# Patient Record
Sex: Female | Born: 1970 | Race: White | Hispanic: No | Marital: Married | Smoking: Never smoker
Health system: Southern US, Community
[De-identification: ages and names within clinical notes are randomized; demographics above are authoritative.]

## PROBLEM LIST (undated history)

## (undated) DIAGNOSIS — F419 Anxiety disorder, unspecified: Secondary | ICD-10-CM

## (undated) DIAGNOSIS — K819 Cholecystitis, unspecified: Secondary | ICD-10-CM

## (undated) DIAGNOSIS — F988 Other specified behavioral and emotional disorders with onset usually occurring in childhood and adolescence: Secondary | ICD-10-CM

## (undated) DIAGNOSIS — R002 Palpitations: Secondary | ICD-10-CM

## (undated) DIAGNOSIS — B029 Zoster without complications: Secondary | ICD-10-CM

## (undated) DIAGNOSIS — N83209 Unspecified ovarian cyst, unspecified side: Secondary | ICD-10-CM

## (undated) DIAGNOSIS — T783XXA Angioneurotic edema, initial encounter: Secondary | ICD-10-CM

## (undated) DIAGNOSIS — R42 Dizziness and giddiness: Secondary | ICD-10-CM

## (undated) HISTORY — DX: Cholecystitis, unspecified: K81.9

## (undated) HISTORY — DX: Unspecified ovarian cyst, unspecified side: N83.209

## (undated) HISTORY — DX: Anxiety disorder, unspecified: F41.9

## (undated) HISTORY — DX: Zoster without complications: B02.9

## (undated) HISTORY — DX: Palpitations: R00.2

## (undated) HISTORY — DX: Dizziness and giddiness: R42

## (undated) HISTORY — DX: Angioneurotic edema, initial encounter: T78.3XXA

## (undated) HISTORY — DX: Other specified behavioral and emotional disorders with onset usually occurring in childhood and adolescence: F98.8

---

## 1998-03-02 ENCOUNTER — Other Ambulatory Visit: Admission: RE | Admit: 1998-03-02 | Discharge: 1998-03-02 | Payer: Self-pay | Admitting: *Deleted

## 1998-11-23 ENCOUNTER — Encounter (INDEPENDENT_AMBULATORY_CARE_PROVIDER_SITE_OTHER): Payer: Self-pay | Admitting: Specialist

## 1998-11-23 ENCOUNTER — Inpatient Hospital Stay (HOSPITAL_COMMUNITY): Admission: AD | Admit: 1998-11-23 | Discharge: 1998-11-26 | Payer: Self-pay | Admitting: Obstetrics and Gynecology

## 1998-11-27 ENCOUNTER — Encounter (HOSPITAL_COMMUNITY): Admission: RE | Admit: 1998-11-27 | Discharge: 1999-02-25 | Payer: Self-pay | Admitting: Obstetrics and Gynecology

## 1998-12-20 ENCOUNTER — Other Ambulatory Visit: Admission: RE | Admit: 1998-12-20 | Discharge: 1998-12-20 | Payer: Self-pay | Admitting: Obstetrics and Gynecology

## 1999-12-25 ENCOUNTER — Other Ambulatory Visit: Admission: RE | Admit: 1999-12-25 | Discharge: 1999-12-25 | Payer: Self-pay | Admitting: Obstetrics and Gynecology

## 2000-12-25 ENCOUNTER — Other Ambulatory Visit: Admission: RE | Admit: 2000-12-25 | Discharge: 2000-12-25 | Payer: Self-pay | Admitting: Obstetrics and Gynecology

## 2002-01-04 ENCOUNTER — Other Ambulatory Visit: Admission: RE | Admit: 2002-01-04 | Discharge: 2002-01-04 | Payer: Self-pay | Admitting: Obstetrics & Gynecology

## 2002-08-31 ENCOUNTER — Encounter: Payer: Self-pay | Admitting: Family Medicine

## 2002-08-31 ENCOUNTER — Encounter: Admission: RE | Admit: 2002-08-31 | Discharge: 2002-08-31 | Payer: Self-pay | Admitting: Family Medicine

## 2003-02-10 ENCOUNTER — Other Ambulatory Visit: Admission: RE | Admit: 2003-02-10 | Discharge: 2003-02-10 | Payer: Self-pay | Admitting: Obstetrics & Gynecology

## 2008-06-20 ENCOUNTER — Emergency Department (HOSPITAL_COMMUNITY): Admission: EM | Admit: 2008-06-20 | Discharge: 2008-06-20 | Payer: Self-pay | Admitting: Family Medicine

## 2010-07-11 LAB — GLUCOSE, CAPILLARY: Glucose-Capillary: 98 mg/dL (ref 70–99)

## 2010-07-11 LAB — POCT URINALYSIS DIP (DEVICE): Glucose, UA: NEGATIVE mg/dL

## 2010-07-11 LAB — POCT PREGNANCY, URINE: Preg Test, Ur: NEGATIVE

## 2010-12-19 ENCOUNTER — Other Ambulatory Visit: Payer: Self-pay | Admitting: Obstetrics & Gynecology

## 2010-12-20 ENCOUNTER — Other Ambulatory Visit (HOSPITAL_COMMUNITY): Payer: Self-pay | Admitting: Family Medicine

## 2010-12-20 DIAGNOSIS — R1011 Right upper quadrant pain: Secondary | ICD-10-CM

## 2011-01-03 ENCOUNTER — Other Ambulatory Visit (HOSPITAL_COMMUNITY): Payer: Self-pay

## 2011-01-08 ENCOUNTER — Other Ambulatory Visit: Payer: Self-pay | Admitting: Obstetrics & Gynecology

## 2011-01-08 DIAGNOSIS — Z1231 Encounter for screening mammogram for malignant neoplasm of breast: Secondary | ICD-10-CM

## 2011-01-22 ENCOUNTER — Ambulatory Visit
Admission: RE | Admit: 2011-01-22 | Discharge: 2011-01-22 | Disposition: A | Discharge status: Home / Self Care | Payer: BC Managed Care – PPO | Source: Ambulatory Visit | Attending: Obstetrics & Gynecology | Admitting: Obstetrics & Gynecology

## 2011-01-22 DIAGNOSIS — Z1231 Encounter for screening mammogram for malignant neoplasm of breast: Secondary | ICD-10-CM

## 2011-10-10 ENCOUNTER — Ambulatory Visit
Admission: RE | Admit: 2011-10-10 | Discharge: 2011-10-10 | Disposition: A | Discharge status: Home / Self Care | Payer: BC Managed Care – PPO | Source: Ambulatory Visit | Attending: Family Medicine | Admitting: Family Medicine

## 2011-10-10 ENCOUNTER — Other Ambulatory Visit: Payer: Self-pay | Admitting: Family Medicine

## 2011-10-10 DIAGNOSIS — M25519 Pain in unspecified shoulder: Secondary | ICD-10-CM

## 2012-10-25 ENCOUNTER — Other Ambulatory Visit: Payer: Self-pay

## 2012-10-25 DIAGNOSIS — Z1231 Encounter for screening mammogram for malignant neoplasm of breast: Secondary | ICD-10-CM

## 2012-11-10 ENCOUNTER — Ambulatory Visit
Admission: RE | Admit: 2012-11-10 | Discharge: 2012-11-10 | Disposition: A | Discharge status: Home / Self Care | Payer: BC Managed Care – PPO | Source: Ambulatory Visit

## 2012-11-10 DIAGNOSIS — Z1231 Encounter for screening mammogram for malignant neoplasm of breast: Secondary | ICD-10-CM

## 2014-05-05 ENCOUNTER — Other Ambulatory Visit: Payer: Self-pay

## 2014-05-05 DIAGNOSIS — Z1231 Encounter for screening mammogram for malignant neoplasm of breast: Secondary | ICD-10-CM

## 2014-05-11 ENCOUNTER — Ambulatory Visit
Admission: RE | Admit: 2014-05-11 | Discharge: 2014-05-11 | Disposition: A | Discharge status: Home / Self Care | Payer: 59 | Source: Ambulatory Visit

## 2014-05-11 ENCOUNTER — Encounter (INDEPENDENT_AMBULATORY_CARE_PROVIDER_SITE_OTHER): Payer: Self-pay

## 2014-05-11 DIAGNOSIS — Z1231 Encounter for screening mammogram for malignant neoplasm of breast: Secondary | ICD-10-CM

## 2014-11-10 ENCOUNTER — Ambulatory Visit (INDEPENDENT_AMBULATORY_CARE_PROVIDER_SITE_OTHER): Payer: 59 | Admitting: Gynecology

## 2014-11-10 ENCOUNTER — Encounter: Payer: Self-pay | Admitting: Gynecology

## 2014-11-10 VITALS — BP 118/74 | Ht 64.0 in | Wt 139.0 lb

## 2014-11-10 DIAGNOSIS — N926 Irregular menstruation, unspecified: Secondary | ICD-10-CM

## 2014-11-10 LAB — CBC WITH DIFFERENTIAL/PLATELET
Basophils Absolute: 0 K/uL (ref 0.0–0.1)
Basophils Relative: 0 % (ref 0–1)
Eosinophils Absolute: 0.1 K/uL (ref 0.0–0.7)
Eosinophils Relative: 1 % (ref 0–5)
HCT: 39.4 % (ref 36.0–46.0)
Hemoglobin: 13 g/dL (ref 12.0–15.0)
Lymphocytes Relative: 29 % (ref 12–46)
Lymphs Abs: 1.7 K/uL (ref 0.7–4.0)
MCH: 30 pg (ref 26.0–34.0)
MCHC: 33 g/dL (ref 30.0–36.0)
MCV: 91 fL (ref 78.0–100.0)
MPV: 9.5 fL (ref 8.6–12.4)
Monocytes Absolute: 0.4 K/uL (ref 0.1–1.0)
Monocytes Relative: 7 % (ref 3–12)
Neutro Abs: 3.7 K/uL (ref 1.7–7.7)
Neutrophils Relative %: 63 % (ref 43–77)
Platelets: 272 K/uL (ref 150–400)
RBC: 4.33 MIL/uL (ref 3.87–5.11)
RDW: 12.7 % (ref 11.5–15.5)
WBC: 5.9 K/uL (ref 4.0–10.5)

## 2014-11-10 LAB — TSH: TSH: 2.612 u[IU]/mL (ref 0.350–4.500)

## 2014-11-10 NOTE — Progress Notes (Signed)
Chloe Hanson May 31, 1970 [id number]        44 y.o.  [id number] new patient presents complaining of irregular bleeding. Patient notes regular monthly menses up until several years ago when her menses started to become more irregular where she would have a period every 2 weeks. Had trial of several different birth control pills but could not tolerate them due to mental changes. Had tried a Mirena IUD but had it removed due to pelvic cramping particularly after intercourse. Most recently saw Dr. Dellis Filbert  this past Februaryand had an ultrasound where she was told she had an ovarian cyst several centimeters that would probably resolve on its own. She received a Depo-Provera and had some breakthrough bleeding and received a second dose in May but now is noted spotting with heavier bleeding last week. Has history of one cesarean section using condoms for contraception and desires no further childbirth. Relates having a full exam to include breast exam in February last Pap smear reported 2014 which was normal historically and no history of abnormal Pap smears previously.  Past medical history,surgical history, problem list, medications, allergies, family history and social history were all reviewed and documented in the EPIC chart.  Directed ROS with pertinent positives and negatives documented in the history of present illness/assessment and plan.  Exam: Kim assistant Filed Vitals:   11/10/14 1414  BP: 118/74  Height: 5\' 4"  (1.626 m)  Weight: 139 lb (63.05 kg)   General appearance:  Normal Spine straight without CVA tenderness Abdomen soft nontender without masses guarding rebound Pelvic external BUS vagina normal. Cervix normal. Uterus grossly normal size midline mobile nontender. Adnexa without masses or tenderness.  Assessment/Plan:  44 y.o. [id number] irregular bleeding currently at the tail end of her Depo-Provera. Inability to tolerate oral contraceptives. Trial of Mirena IUD unsuccessful due to  cramping. Will start with baseline labs to include CBC TSH FSH prolactin and hCG. Schedule sonohysterogram to rule out intracavitary pathology and reassess ovaries with history of cyst in February. Various scenarios and possibilities reviewed to include retry of Mirena IUD or Skyla. Will rediscuss treatment options after labs and ultrasound results.    Anastasio Auerbach MD, 2:58 PM 11/10/2014

## 2014-11-10 NOTE — Patient Instructions (Signed)
Follow up for ultrasound as scheduled 

## 2014-11-11 LAB — PROLACTIN: PROLACTIN: 14.2 ng/mL

## 2014-11-11 LAB — HCG, SERUM, QUALITATIVE: PREG SERUM: NEGATIVE

## 2014-11-11 LAB — FOLLICLE STIMULATING HORMONE: FSH: 29.6 m[IU]/mL

## 2014-11-14 ENCOUNTER — Other Ambulatory Visit: Payer: Self-pay | Admitting: Gynecology

## 2014-11-14 DIAGNOSIS — N939 Abnormal uterine and vaginal bleeding, unspecified: Secondary | ICD-10-CM

## 2014-11-30 ENCOUNTER — Telehealth: Payer: Self-pay | Admitting: Gynecology

## 2014-11-30 NOTE — Telephone Encounter (Signed)
11/30/14-PT was advised today the her [employer] ins puts the cost of her sonohysterogram towards her $500 deductible of which only $256.31 is met. The allowable for the test is $875.76 so she will also be responsible for 20% coins on the $632.37 remaining after her remaining $243.39 of the deductible is subtracted from the allowable. If bx is needed she would have a 20% coins on $269.62 or $53.92 additional. She has a remaining balance on her oop of $1222.17 so that is why she owes what she does.wl

## 2014-12-07 ENCOUNTER — Encounter: Payer: Self-pay | Admitting: Gynecology

## 2014-12-07 ENCOUNTER — Other Ambulatory Visit: Payer: Self-pay | Admitting: Gynecology

## 2014-12-07 ENCOUNTER — Ambulatory Visit (INDEPENDENT_AMBULATORY_CARE_PROVIDER_SITE_OTHER): Payer: 59 | Admitting: Gynecology

## 2014-12-07 ENCOUNTER — Ambulatory Visit (INDEPENDENT_AMBULATORY_CARE_PROVIDER_SITE_OTHER): Payer: 59

## 2014-12-07 VITALS — BP 124/80 | Ht 64.0 in | Wt 139.0 lb

## 2014-12-07 DIAGNOSIS — N8320 Unspecified ovarian cysts: Secondary | ICD-10-CM

## 2014-12-07 DIAGNOSIS — N939 Abnormal uterine and vaginal bleeding, unspecified: Secondary | ICD-10-CM | POA: Diagnosis not present

## 2014-12-07 DIAGNOSIS — D251 Intramural leiomyoma of uterus: Secondary | ICD-10-CM

## 2014-12-07 DIAGNOSIS — N926 Irregular menstruation, unspecified: Secondary | ICD-10-CM

## 2014-12-07 DIAGNOSIS — N83202 Unspecified ovarian cyst, left side: Secondary | ICD-10-CM

## 2014-12-07 NOTE — Patient Instructions (Signed)
Follow up if your irregular bleeding continues.  Office will call you with the biopsy results

## 2014-12-07 NOTE — Progress Notes (Signed)
Chloe Hanson 05-Nov-1970 [id number]        44 y.o.  [id number] Presents for sonohysterogram due to irregular bleeding as outlined in my 11/10/2014 note. Patient notes that she has done no further bleeding since her office appointment.  Past medical history,surgical history, problem list, medications, allergies, family history and social history were all reviewed and documented in the EPIC chart.  Directed ROS with pertinent positives and negatives documented in the history of present illness/assessment and plan.  Exam: [place] assistant Filed Vitals:   12/07/14 1458  BP: 124/80  Height: 5\' 4"  (1.626 m)  Weight: 139 lb (63.05 kg)   General appearance:  Normal Pelvic external BUS vagina normal. Cervix normal. Uterus normal size midline mobile nontender. Adnexa without masses or tenderness.  Ultrasound shows uterus normal size with several small myomas 19 mm and 9 mm.  Endometrial echo 3.7 mm Right ovary normal. Left ovary with thin-walled echo-free avascular 32 mm mean cyst. Cul-de-sac negative.  Sonohysterogram performed, sterile technique, easy catheter introduction, good distention with no abnormalities.  Endometrial biopsy taken. Patient tolerated well.  Assessment/Plan:  44 y.o. [id number] with irregular bleeding over the past year  Having been tried on several different birth control pills, Mirena IUD and subsequent Depo-Provera all with continued bleeding. Has stopped at this point. Had normal TSH prolactin and hemoglobin of 13. FSH was mildly elevated at 29. Patient will follow up for biopsy results. Keep menstrual calendar over the next several months. Possible early menopause discussed or possibly timing during the menstrual cycle accounts for mild elevation. Will follow up if irregular menses continue. If resolves then will follow up when she is due for her annual exam.    Anastasio Auerbach MD, 3:15 PM 12/07/2014

## 2015-02-11 DIAGNOSIS — F909 Attention-deficit hyperactivity disorder, unspecified type: Secondary | ICD-10-CM | POA: Insufficient documentation

## 2015-06-21 ENCOUNTER — Telehealth: Payer: Self-pay | Admitting: *Deleted

## 2015-06-21 NOTE — Telephone Encounter (Signed)
Patient has never had an annual exam with me. Her evaluation last September was due to heavy bleeding. I did recommend scheduling an exam now and we can discuss birth control options.

## 2015-06-21 NOTE — Telephone Encounter (Signed)
Pt informed with the below note, pt was at work and said she will call back to schedule annual exam

## 2015-06-21 NOTE — Telephone Encounter (Signed)
Pt called requesting Rx for OrthoEvra patch could be sent to pharmacy? Pt annual due in August, or OV to discuss? Please advise

## 2016-06-23 ENCOUNTER — Other Ambulatory Visit: Payer: Self-pay | Admitting: Gynecology

## 2016-06-23 DIAGNOSIS — Z1231 Encounter for screening mammogram for malignant neoplasm of breast: Secondary | ICD-10-CM

## 2016-07-09 ENCOUNTER — Ambulatory Visit
Admission: RE | Admit: 2016-07-09 | Discharge: 2016-07-09 | Disposition: A | Discharge status: Home / Self Care | Payer: 59 | Source: Ambulatory Visit | Attending: Gynecology | Admitting: Gynecology

## 2016-07-09 DIAGNOSIS — Z1231 Encounter for screening mammogram for malignant neoplasm of breast: Secondary | ICD-10-CM

## 2016-07-16 ENCOUNTER — Encounter: Payer: Self-pay | Admitting: Gynecology

## 2016-07-16 ENCOUNTER — Ambulatory Visit (INDEPENDENT_AMBULATORY_CARE_PROVIDER_SITE_OTHER): Payer: 59 | Admitting: Gynecology

## 2016-07-16 VITALS — BP 116/70 | Ht 65.0 in | Wt 145.0 lb

## 2016-07-16 DIAGNOSIS — N951 Menopausal and female climacteric states: Secondary | ICD-10-CM | POA: Diagnosis not present

## 2016-07-16 DIAGNOSIS — Z01419 Encounter for gynecological examination (general) (routine) without abnormal findings: Secondary | ICD-10-CM

## 2016-07-16 MED ORDER — NORETHINDRONE ACET-ETHINYL EST 1-20 MG-MCG PO TABS: 1.0000 | ORAL_TABLET | Freq: Every day | ORAL | 6 refills | Status: DC

## 2016-07-16 NOTE — Progress Notes (Signed)
    Chloe Hanson Apr 30, 1970 [id number]        46 y.o.  [id number] for annual exam.  Also complaining of menopausal symptoms with primarily having hot flushes and night sweats. Also noting mood swings and irritability. Some foggy thinking. Menses are somewhat irregular where she will have some skips. Overall though still having monthly menses. Had workup 2 years ago to include sonohysterogram which was negative and FSH which was 29.  Past medical history,surgical history, problem list, medications, allergies, family history and social history were all reviewed and documented as reviewed in the EPIC chart.  ROS:  Performed with pertinent positives and negatives included in the history, assessment and plan.   Additional significant findings :  None   Exam: [name] assistant Vitals:   07/16/16 1545  BP: 116/70  Weight: 145 lb (65.8 kg)  Height: 5\' 5"  (1.651 m)   Body mass index is 24.13 kg/m.  General appearance:  Normal affect, orientation and appearance. Skin: Grossly normal HEENT: Without gross lesions.  No cervical or supraclavicular adenopathy. Thyroid normal.  Lungs:  Clear without wheezing, rales or rhonchi Cardiac: RR, without RMG Abdominal:  Soft, nontender, without masses, guarding, rebound, organomegaly or hernia Breasts:  Examined lying and sitting without masses, retractions, discharge or axillary adenopathy. Pelvic:  Ext, BUS, Vagina: Normal  Cervix: Normal  Uterus: Anteverted, normal size, shape and contour, midline and mobile nontender   Adnexa: Without masses or tenderness    Anus and perineum: Normal   Rectovaginal: Normal sphincter tone without palpated masses or tenderness.    Assessment/Plan:  46 y.o. [id number] female for annual exam.   1. Menopausal symptoms. The patient is having classic menopausal symptoms. I discussed the perimenopause with the patient and options to include observation, OTC products and prescription medication. I reviewed HRT with her.  Given that she still is having some menses I feel she'll be having an issue with irregular bleeding on low-dose HRT. I also reviewed starting low-dose birth control pills which would give her both menstrual regulation and hopefully symptom relief. She is healthy and never smoked. Risks to include thrombosis such as stroke heart attack DVT discussed. Patient wants to go ahead and start this and will start her on a Loestrin 1/20 equivalent. Refill 1 year provided. Every other to every third month withdrawal option discussed. This also will give her more consistent birth control as she is using condoms now. 2. Mammography 06/2016. Continue with annual mammography next year. SBE monthly. 3. Pap smear 2014. Pap smear done today. No history of significant abnormal Pap smears. 4. Health maintenance. Patient relates recently having full blood work done through her primary physician's office. Follow up in one year, sooner if any issues with initiation of her low-dose oral contraceptives.  Additional time in excess of her routine gynecologic exam was spent in direct face to face counseling and coordination of care in regards to her menopausal symptoms and management.    Anastasio Auerbach MD, 4:09 PM 07/16/2016

## 2016-07-16 NOTE — Patient Instructions (Signed)
Start on birth control pills as we discussed. Call me if you have any issues with these at all.

## 2016-07-16 NOTE — Addendum Note (Signed)
Addended by: Nelva Nay on: 07/16/2016 04:42 PM   Modules accepted: Orders

## 2016-07-17 LAB — PAP IG W/ RFLX HPV ASCU

## 2017-02-27 ENCOUNTER — Ambulatory Visit (INDEPENDENT_AMBULATORY_CARE_PROVIDER_SITE_OTHER): Payer: 59 | Admitting: Gynecology

## 2017-02-27 ENCOUNTER — Encounter: Payer: Self-pay | Admitting: Gynecology

## 2017-02-27 VITALS — BP 118/74

## 2017-02-27 DIAGNOSIS — N926 Irregular menstruation, unspecified: Secondary | ICD-10-CM

## 2017-02-27 NOTE — Progress Notes (Signed)
    Chloe Hanson 1970/05/17 [id number]        46 y.o.  [id number] presents with 3 months of postcoital spotting.  Was started on oral contraceptives earlier this year due to mild menstrual irregularity with some skips and menopausal symptoms to include hot flushes and sweats.  Patient notes her symptoms are much better and was doing well initially but now she is having consistent spotting after intercourse although no spontaneous bleeding.  Also noting some pelvic discomfort after intercourse.  Is doing an every 4-month withdrawal on a Loestrin 1/20 equivalent.  Had sonohysterogram 2016 due to irregular bleeding which showed several small myomas but otherwise unremarkable.  Past medical history,surgical history, problem list, medications, allergies, family history and social history were all reviewed and documented in the EPIC chart.  Directed ROS with pertinent positives and negatives documented in the history of present illness/assessment and plan.  Exam: [name] assistant Vitals:   02/27/17 1547  BP: 118/74   General appearance:  Normal Abdomen soft nontender without masses guarding rebound Pelvic external BUS vagina normal.  Cervix grossly normal.  Uterus normal size midline mobile nontender.  Adnexa without masses or tenderness.  Assessment/Plan:  46 y.o. [id number] with postcoital spotting times 3 months.  Pap smear 06/2016 normal.  Recommend start with ultrasound rule out pelvic pathology and for endometrial assessment.  Possible sonohysterogram if endometrial echo thickened or abnormal.  Possible adjustments of her oral contraceptives to stabilize the endometrium with higher estrogen dose.  Will reassess after the ultrasound.    Anastasio Auerbach MD, 4:02 PM 02/27/2017

## 2017-02-27 NOTE — Patient Instructions (Signed)
Follow up for ultrasound as scheduled 

## 2017-03-06 ENCOUNTER — Other Ambulatory Visit: Payer: Self-pay | Admitting: Gynecology

## 2017-03-10 NOTE — Telephone Encounter (Signed)
Refill called IN [facility]

## 2017-03-12 ENCOUNTER — Other Ambulatory Visit: Payer: Self-pay | Admitting: Gynecology

## 2017-03-12 DIAGNOSIS — N930 Postcoital and contact bleeding: Secondary | ICD-10-CM

## 2017-04-02 ENCOUNTER — Ambulatory Visit (INDEPENDENT_AMBULATORY_CARE_PROVIDER_SITE_OTHER): Payer: 59 | Admitting: Gynecology

## 2017-04-02 ENCOUNTER — Other Ambulatory Visit: Payer: Self-pay | Admitting: Gynecology

## 2017-04-02 ENCOUNTER — Ambulatory Visit (INDEPENDENT_AMBULATORY_CARE_PROVIDER_SITE_OTHER): Payer: 59

## 2017-04-02 ENCOUNTER — Encounter: Payer: Self-pay | Admitting: Gynecology

## 2017-04-02 VITALS — BP 116/74

## 2017-04-02 DIAGNOSIS — N930 Postcoital and contact bleeding: Secondary | ICD-10-CM | POA: Diagnosis not present

## 2017-04-02 DIAGNOSIS — N926 Irregular menstruation, unspecified: Secondary | ICD-10-CM | POA: Diagnosis not present

## 2017-04-02 DIAGNOSIS — D259 Leiomyoma of uterus, unspecified: Secondary | ICD-10-CM

## 2017-04-02 DIAGNOSIS — D251 Intramural leiomyoma of uterus: Secondary | ICD-10-CM

## 2017-04-02 NOTE — Progress Notes (Signed)
    Chloe Hanson 05-Jun-1970 [id number]        47 y.o.  [id number] presents for sonohysterogram due to postcoital bleeding.  History of leiomyoma in the past.  On low-dose oral contraceptives.  Past medical history,surgical history, problem list, medications, allergies, family history and social history were all reviewed and documented in the EPIC chart.  Directed ROS with pertinent positives and negatives documented in the history of present illness/assessment and plan.  Exam: Pam [place] assistant Vitals:   04/02/17 1528  BP: 116/74   General appearance:  Normal Abdomen soft nontender without masses guarding rebound Pelvic external BUS vagina normal.  Cervix normal.  Uterus normal size midline mobile nontender.  Adnexa without masses or tenderness  Ultrasound transvaginal and transabdominal shows uterus normal size with 2 small myomas 20 mm and 17 mm.  Endometrial echo 7.7 mm.  Right ovary normal.  Left ovary with 2 simple echo-free cysts 21 and 22 mm.  Negative color flow Doppler.  Cul-de-sac negative.  Sonohysterogram performed, sterile technique, easy catheter introduction, good distention with no abnormalities.  Endometrial sample taken.  Patient tolerated well    Assessment/Plan:  47 y.o. [id number] with postcoital bleeding.  Ultrasound shows 2 small myomas but cavity otherwise normal.  Endometrial sample taken.  Patient will follow-up for results.  For now we will keep menstrual calendar and will see if her bleeding does not resolve.  If it continues we discussed several options to include going to an every other month withdrawal versus every 9-month withdrawal and see if this does not help stabilize the endometrium versus switching to a different pill such as a higher estrogen 1/35 formulation.  Patient will monitor her bleeding for now and call if she continues to have postcoital spotting.    Anastasio Auerbach MD, 3:41 PM 04/02/2017

## 2017-04-02 NOTE — Patient Instructions (Signed)
Office will call you with biopsy results.  Call if the irregular bleeding continues. 

## 2017-07-17 ENCOUNTER — Encounter: Payer: 59 | Admitting: Gynecology

## 2017-07-20 ENCOUNTER — Encounter: Payer: 59 | Admitting: Gynecology

## 2017-07-20 DIAGNOSIS — Z0289 Encounter for other administrative examinations: Secondary | ICD-10-CM

## 2017-07-24 ENCOUNTER — Other Ambulatory Visit: Payer: Self-pay | Admitting: Gynecology

## 2017-09-18 ENCOUNTER — Other Ambulatory Visit: Payer: Self-pay | Admitting: Gynecology

## 2017-11-27 DIAGNOSIS — M5137 Other intervertebral disc degeneration, lumbosacral region: Secondary | ICD-10-CM | POA: Diagnosis not present

## 2017-11-27 DIAGNOSIS — M9903 Segmental and somatic dysfunction of lumbar region: Secondary | ICD-10-CM | POA: Diagnosis not present

## 2017-11-27 DIAGNOSIS — M9904 Segmental and somatic dysfunction of sacral region: Secondary | ICD-10-CM | POA: Diagnosis not present

## 2017-12-02 DIAGNOSIS — M5137 Other intervertebral disc degeneration, lumbosacral region: Secondary | ICD-10-CM | POA: Diagnosis not present

## 2017-12-02 DIAGNOSIS — M9904 Segmental and somatic dysfunction of sacral region: Secondary | ICD-10-CM | POA: Diagnosis not present

## 2017-12-02 DIAGNOSIS — M9903 Segmental and somatic dysfunction of lumbar region: Secondary | ICD-10-CM | POA: Diagnosis not present

## 2017-12-11 DIAGNOSIS — M9904 Segmental and somatic dysfunction of sacral region: Secondary | ICD-10-CM | POA: Diagnosis not present

## 2017-12-11 DIAGNOSIS — M5137 Other intervertebral disc degeneration, lumbosacral region: Secondary | ICD-10-CM | POA: Diagnosis not present

## 2017-12-11 DIAGNOSIS — M9903 Segmental and somatic dysfunction of lumbar region: Secondary | ICD-10-CM | POA: Diagnosis not present

## 2017-12-16 DIAGNOSIS — M9904 Segmental and somatic dysfunction of sacral region: Secondary | ICD-10-CM | POA: Diagnosis not present

## 2017-12-16 DIAGNOSIS — M5137 Other intervertebral disc degeneration, lumbosacral region: Secondary | ICD-10-CM | POA: Diagnosis not present

## 2017-12-16 DIAGNOSIS — M9903 Segmental and somatic dysfunction of lumbar region: Secondary | ICD-10-CM | POA: Diagnosis not present

## 2018-01-11 ENCOUNTER — Ambulatory Visit (INDEPENDENT_AMBULATORY_CARE_PROVIDER_SITE_OTHER): Payer: 59 | Admitting: Family Medicine

## 2018-01-11 ENCOUNTER — Encounter: Payer: Self-pay | Admitting: Family Medicine

## 2018-01-11 VITALS — BP 104/82 | HR 90 | Temp 98.5°F | Ht 65.0 in | Wt 147.0 lb

## 2018-01-11 DIAGNOSIS — J302 Other seasonal allergic rhinitis: Secondary | ICD-10-CM

## 2018-01-11 DIAGNOSIS — Z7689 Persons encountering health services in other specified circumstances: Secondary | ICD-10-CM

## 2018-01-11 DIAGNOSIS — R232 Flushing: Secondary | ICD-10-CM

## 2018-01-11 DIAGNOSIS — F419 Anxiety disorder, unspecified: Secondary | ICD-10-CM

## 2018-01-11 DIAGNOSIS — Z8669 Personal history of other diseases of the nervous system and sense organs: Secondary | ICD-10-CM

## 2018-01-11 DIAGNOSIS — R631 Polydipsia: Secondary | ICD-10-CM | POA: Diagnosis not present

## 2018-01-11 DIAGNOSIS — F988 Other specified behavioral and emotional disorders with onset usually occurring in childhood and adolescence: Secondary | ICD-10-CM

## 2018-01-11 LAB — POCT GLYCOSYLATED HEMOGLOBIN (HGB A1C): HEMOGLOBIN A1C: 5.2 % (ref 4.0–5.6)

## 2018-01-11 LAB — POC URINALSYSI DIPSTICK (AUTOMATED)
BILIRUBIN UA: NEGATIVE
Blood, UA: NEGATIVE
GLUCOSE UA: NEGATIVE
KETONES UA: NEGATIVE
Leukocytes, UA: NEGATIVE
Nitrite, UA: NEGATIVE
PH UA: 7 (ref 5.0–8.0)
Protein, UA: NEGATIVE
SPEC GRAV UA: 1.01 (ref 1.010–1.025)
Urobilinogen, UA: 0.2 E.U./dL

## 2018-01-11 NOTE — Patient Instructions (Signed)
Perimenopause Perimenopause is the time when your body begins to move into the menopause (no menstrual period for 12 straight months). It is a natural process. Perimenopause can begin 2-8 years before the menopause and usually lasts for 1 year after the menopause. During this time, your ovaries may or may not produce an egg. The ovaries vary in their production of estrogen and progesterone hormones each month. This can cause irregular menstrual periods, difficulty getting pregnant, vaginal bleeding between periods, and uncomfortable symptoms. What are the causes?  Irregular production of the ovarian hormones, estrogen and progesterone, and not ovulating every month. Other causes include:  Tumor of the pituitary gland in the brain.  Medical disease that affects the ovaries.  Radiation treatment.  Chemotherapy.  Unknown causes.  Heavy smoking and excessive alcohol intake can bring on perimenopause sooner.  What are the signs or symptoms?  Hot flashes.  Night sweats.  Irregular menstrual periods.  Decreased sex drive.  Vaginal dryness.  Headaches.  Mood swings.  Depression.  Memory problems.  Irritability.  Tiredness.  Weight gain.  Trouble getting pregnant.  The beginning of losing bone cells (osteoporosis).  The beginning of hardening of the arteries (atherosclerosis). How is this diagnosed? Your health care provider will make a diagnosis by analyzing your age, menstrual history, and symptoms. He or she will do a physical exam and note any changes in your body, especially your female organs. Female hormone tests may or may not be helpful depending on the amount of female hormones you produce and when you produce them. However, other hormone tests may be helpful to rule out other problems. How is this treated? In some cases, no treatment is needed. The decision on whether treatment is necessary during the perimenopause should be made by you and your health care  provider based on how the symptoms are affecting you and your lifestyle. Various treatments are available, such as:  Treating individual symptoms with a specific medicine for that symptom.  Herbal medicines that can help specific symptoms.  Counseling.  Group therapy.  Follow these instructions at home:  Keep track of your menstrual periods (when they occur, how heavy they are, how long between periods, and how long they last) as well as your symptoms and when they started.  Only take over-the-counter or prescription medicines as directed by your health care provider.  Sleep and rest.  Exercise.  Eat a diet that contains calcium (good for your bones) and soy (acts like the estrogen hormone).  Do not smoke.  Avoid alcoholic beverages.  Take vitamin supplements as recommended by your health care provider. Taking vitamin E may help in certain cases.  Take calcium and vitamin D supplements to help prevent bone loss.  Group therapy is sometimes helpful.  Acupuncture may help in some cases. Contact a health care provider if:  You have questions about any symptoms you are having.  You need a referral to a specialist (gynecologist, psychiatrist, or psychologist). Get help right away if:  You have vaginal bleeding.  Your period lasts longer than 8 days.  Your periods are recurring sooner than 21 days.  You have bleeding after intercourse.  You have severe depression.  You have pain when you urinate.  You have severe headaches.  You have vision problems. This information is not intended to replace advice given to you by your health care provider. Make sure you discuss any questions you have with your health care provider. Document Released: 04/24/2004 Document Revised: 08/23/2015 Document Reviewed: 10/14/2012  Elsevier Interactive Patient Education  2017 [employer].  Migraine Headache A migraine headache is an intense, throbbing pain on one side or both sides of  the head. Migraines may also cause other symptoms, such as nausea, vomiting, and sensitivity to light and noise. What are the causes? Doing or taking certain things may also trigger migraines, such as:  Alcohol.  Smoking.  Medicines, such as: ? Medicine used to treat chest pain (nitroglycerine). ? Birth control pills. ? Estrogen pills. ? Certain blood pressure medicines.  Aged cheeses, chocolate, or caffeine.  Foods or drinks that contain nitrates, glutamate, aspartame, or tyramine.  Physical activity.  Other things that may trigger a migraine include:  Menstruation.  Pregnancy.  Hunger.  Stress, lack of sleep, too much sleep, or fatigue.  Weather changes.  What increases the risk? The following factors may make you more likely to experience migraine headaches:  Age. Risk increases with age.  Family history of migraine headaches.  Being Caucasian.  Depression and anxiety.  Obesity.  Being a woman.  Having a hole in the heart (patent foramen ovale) or other heart problems.  What are the signs or symptoms? The main symptom of this condition is pulsating or throbbing pain. Pain may:  Happen in any area of the head, such as on one side or both sides.  Interfere with daily activities.  Get worse with physical activity.  Get worse with exposure to bright lights or loud noises.  Other symptoms may include:  Nausea.  Vomiting.  Dizziness.  General sensitivity to bright lights, loud noises, or smells.  Before you get a migraine, you may get warning signs that a migraine is developing (aura). An aura may include:  Seeing flashing lights or having blind spots.  Seeing bright spots, halos, or zigzag lines.  Having tunnel vision or blurred vision.  Having numbness or a tingling feeling.  Having trouble talking.  Having muscle weakness.  How is this diagnosed? A migraine headache can be diagnosed based on:  Your symptoms.  A physical  exam.  Tests, such as CT scan or MRI of the head. These imaging tests can help rule out other causes of headaches.  Taking fluid from the spine (lumbar puncture) and analyzing it (cerebrospinal fluid analysis, or CSF analysis).  How is this treated? A migraine headache is usually treated with medicines that:  Relieve pain.  Relieve nausea.  Prevent migraines from coming back.  Treatment may also include:  Acupuncture.  Lifestyle changes like avoiding foods that trigger migraines.  Follow these instructions at home: Medicines  Take over-the-counter and prescription medicines only as told by your health care provider.  Do not drive or use heavy machinery while taking prescription pain medicine.  To prevent or treat constipation while you are taking prescription pain medicine, your health care provider may recommend that you: ? Drink enough fluid to keep your urine clear or pale yellow. ? Take over-the-counter or prescription medicines. ? Eat foods that are high in fiber, such as fresh fruits and vegetables, whole grains, and beans. ? Limit foods that are high in fat and processed sugars, such as fried and sweet foods. Lifestyle  Avoid alcohol use.  Do not use any products that contain nicotine or tobacco, such as cigarettes and e-cigarettes. If you need help quitting, ask your health care provider.  Get at least 8 hours of sleep every night.  Limit your stress. General instructions   Keep a journal to find out what may trigger your migraine  headaches. For example, write down: ? What you eat and drink. ? How much sleep you get. ? Any change to your diet or medicines.  If you have a migraine: ? Avoid things that make your symptoms worse, such as bright lights. ? It may help to lie down in a dark, quiet room. ? Do not drive or use heavy machinery. ? Ask your health care provider what activities are safe for you while you are experiencing symptoms.  Keep all  follow-up visits as told by your health care provider. This is important. Contact a health care provider if:  You develop symptoms that are different or more severe than your usual migraine symptoms. Get help right away if:  Your migraine becomes severe.  You have a fever.  You have a stiff neck.  You have vision loss.  Your muscles feel weak or like you cannot control them.  You start to lose your balance often.  You develop trouble walking.  You faint. This information is not intended to replace advice given to you by your health care provider. Make sure you discuss any questions you have with your health care provider. Document Released: 03/17/2005 Document Revised: 10/05/2015 Document Reviewed: 09/03/2015 Elsevier Interactive Patient Education  2017 [employer].

## 2018-01-11 NOTE — Progress Notes (Signed)
Patient presents to clinic today to f/u on chronic issues and establish care.  SUBJECTIVE: PMH:  Pt is a 47 yo female with pmh sig for ADD, anxiety, migraines, chronic headaches, chronic back pain.  Pt seen at [facility] and by OB/Gyn.   Acute concern: -pt endorses increaesed thirst and urination. -she denies h/o DM -drinks mostly coffee and seltzer water -thought had a UTI a few wks ago, given abx by a friend at [facility]. -thought symptoms were returning so increased fluid intake.  Hot flashes: -LMP Jan 2019 -taking black coash with some relief  ADD: -dx'd by [place] Attention specialist.  Has not seen them in a while -taking Adderall  20 mg in am and 10 mg in pm.  H/o anxiety/PTSD: -pt states she witnessed a shooting. -pt notes she is taking xanax prn which she gets from her friend at [facility]. -Patient also notes falling off a horse in 2013 hurting her back  Seasonal allergies: -may take OTC med  Allergies: Latex  Past surgical history: C-section 2000  Social history: Pt is married.  She has 1 son who is 66.  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  Pt endorses social alcohol use.  Pt denies tobacco and drug use.   Health Maintenance: Dental --2019 Chiropractor--Dr. [facility] --August 2019 Mammogram --February 2018 LMP--January 2019 PAP --2018 or 19   Past Medical History:  Diagnosis Date  . ADD (attention deficit disorder)   . Gall bladder inflammation   . Ovarian cyst   . Shingles     Past Surgical History:  Procedure Laterality Date  . CESAREAN SECTION  2000    Current Outpatient Medications on File Prior to Visit  Medication Sig Dispense Refill  . ALPRAZolam (XANAX) 0.25 MG tablet Take 0.25 mg by mouth at bedtime as needed for anxiety.    Marland Kitchen amphetamine-dextroamphetamine (ADDERALL) 15 MG tablet Take 15 mg by mouth daily.    Marland Kitchen amphetamine-dextroamphetamine (ADDERALL) 20 MG tablet Take 20 mg by mouth daily.    .  norethindrone-ethinyl estradiol (MICROGESTIN,JUNEL,LOESTRIN) 1-20 MG-MCG tablet TAKE 1 TABLET BY MOUTH EVERY DAY CONTINUOSLY 42 tablet 0  . ValACYclovir HCl (VALTREX PO) Take by mouth.     No current facility-administered medications on file prior to visit.     Allergies  Allergen Reactions  . Latex Itching and Rash    Redness and rash with latex use    Family History  Problem Relation Age of Onset  . Breast cancer Mother 49  . Heart disease Father   . Cancer Father        Skin    Social History   Socioeconomic History  . Marital status: Married    Spouse name: Not on file  . Number of children: Not on file  . Years of education: Not on file  . Highest education level: Not on file  Occupational History  . Not on file  Social Needs  . Financial resource strain: Not on file  . Food insecurity:    Worry: Not on file    Inability: Not on file  . Transportation needs:    Medical: Not on file    Non-medical: Not on file  Tobacco Use  . Smoking status: Never Smoker  . Smokeless tobacco: Never Used  Substance and Sexual Activity  . Alcohol use: Yes    Alcohol/week: 0.0 standard drinks    Comment: Rare  . Drug use: No  . Sexual activity: Yes  Birth control/protection: Condom    Comment: 1st intercourse 47 yo-More than 5 partners  Lifestyle  . Physical activity:    Days per week: Not on file    Minutes per session: Not on file  . Stress: Not on file  Relationships  . Social connections:    Talks on phone: Not on file    Gets together: Not on file    Attends religious service: Not on file    Active member of club or organization: Not on file    Attends meetings of clubs or organizations: Not on file    Relationship status: Not on file  . Intimate partner violence:    Fear of current or ex partner: Not on file    Emotionally abused: Not on file    Physically abused: Not on file    Forced sexual activity: Not on file  Other Topics Concern  . Not on file    Social History Narrative  . Not on file    ROS General: Denies fever, chills, night sweats, changes in weight, changes in appetite  +increased thirst, hot flashes HEENT: Denies headaches, ear pain, changes in vision, rhinorrhea, sore throat CV: Denies CP, palpitations, SOB, orthopnea Pulm: Denies SOB, cough, wheezing GI: Denies abdominal pain, nausea, vomiting, diarrhea, constipation GU: Denies dysuria, hematuria, vaginal discharge  +frequency Msk: Denies muscle cramps, joint pains Neuro: Denies weakness, numbness, tingling Skin: Denies rashes, bruising Psych: Denies depression, hallucinations  +anxiety, ADD  BP 104/82 (BP Location: Left Arm, Patient Position: Sitting, Cuff Size: Normal)   Pulse 90   Temp 98.5 F (36.9 C) (Oral)   Ht 5\' 5"  (1.651 m)   Wt 147 lb (66.7 kg)   LMP 04/13/2017   SpO2 98%   BMI 24.46 kg/m   Physical Exam Gen. Pleasant, well developed, well-nourished, in NAD HEENT - [place]/AT, PERRL, no scleral icterus, no nasal drainage, pharynx without erythema or exudate. Lungs: no use of accessory muscles, CTAB, no wheezes, rales or rhonchi Cardiovascular: RRR,  No r/g/m, no peripheral edema Abdomen: BS present, soft, nontender,nondistended Neuro:  A&Ox3, CN II-XII intact, normal gait Skin:  Warm, dry, intact, no lesions  No results found for this or any previous visit (from the past 2160 hour(s)).  Assessment/Plan: Increased thirst  -discussed drinking flat water daily. -hgb A1C was 5.2% this visit -UA normal - Plan: POCT glycosylated hemoglobin (Hb A1C), POCT Urinalysis Dipstick (Automated)  History of migraine -discussed limiting caffeine, getting plenty of rest, and drinking plenty of water daily.  Seasonal allergies -continue OTC meds prn  Anxiety -discussed this provider does not rx Benzodiazepines.  Pt given option of TOC, psychiatry, or weaning off med.  Pt states will get xanax from her previous provider (friend at [facility]).  Attention deficit  disorder (ADD) without hyperactivity -continue Adderall 20 mg in am and 10 mg in pm  Encounter to establish care -We reviewed the PMH, PSH, FH, SH, Meds and Allergies. -We provided refills for any medications we will prescribe as needed. -We addressed current concerns per orders and patient instructions. -We have asked for records for pertinent exams, studies, vaccines and notes from previous providers. -We have advised patient to follow up per instructions below.  Hot flashes -Continue black coash -Given handout  F/u prn.  Pt should strongly consider continuing care at [facility] as they filled several of pt's chronic meds in the last few days including adderall, xanax, valtrex, and apparently restarted her OCPs per chart review.  Grier Mitts, MD

## 2018-01-12 ENCOUNTER — Encounter: Payer: Self-pay | Admitting: Family Medicine

## 2018-01-12 DIAGNOSIS — J302 Other seasonal allergic rhinitis: Secondary | ICD-10-CM | POA: Insufficient documentation

## 2018-01-12 DIAGNOSIS — Z8669 Personal history of other diseases of the nervous system and sense organs: Secondary | ICD-10-CM | POA: Insufficient documentation

## 2018-01-12 DIAGNOSIS — F988 Other specified behavioral and emotional disorders with onset usually occurring in childhood and adolescence: Secondary | ICD-10-CM | POA: Insufficient documentation

## 2018-01-12 DIAGNOSIS — F419 Anxiety disorder, unspecified: Secondary | ICD-10-CM | POA: Insufficient documentation

## 2018-03-08 ENCOUNTER — Other Ambulatory Visit: Payer: Self-pay | Admitting: Gynecology

## 2018-03-08 DIAGNOSIS — Z1231 Encounter for screening mammogram for malignant neoplasm of breast: Secondary | ICD-10-CM

## 2018-03-10 DIAGNOSIS — Z13228 Encounter for screening for other metabolic disorders: Secondary | ICD-10-CM | POA: Diagnosis not present

## 2018-03-15 DIAGNOSIS — Z1283 Encounter for screening for malignant neoplasm of skin: Secondary | ICD-10-CM | POA: Diagnosis not present

## 2018-03-15 DIAGNOSIS — Z0000 Encounter for general adult medical examination without abnormal findings: Secondary | ICD-10-CM | POA: Diagnosis not present

## 2018-04-02 DIAGNOSIS — D1801 Hemangioma of skin and subcutaneous tissue: Secondary | ICD-10-CM | POA: Diagnosis not present

## 2018-04-02 DIAGNOSIS — D225 Melanocytic nevi of trunk: Secondary | ICD-10-CM | POA: Diagnosis not present

## 2018-04-02 DIAGNOSIS — D2261 Melanocytic nevi of right upper limb, including shoulder: Secondary | ICD-10-CM | POA: Diagnosis not present

## 2018-04-02 DIAGNOSIS — D0471 Carcinoma in situ of skin of right lower limb, including hip: Secondary | ICD-10-CM | POA: Diagnosis not present

## 2018-04-09 DIAGNOSIS — B078 Other viral warts: Secondary | ICD-10-CM | POA: Diagnosis not present

## 2018-04-09 DIAGNOSIS — D0471 Carcinoma in situ of skin of right lower limb, including hip: Secondary | ICD-10-CM | POA: Diagnosis not present

## 2018-04-15 ENCOUNTER — Ambulatory Visit
Admission: RE | Admit: 2018-04-15 | Discharge: 2018-04-15 | Disposition: A | Discharge status: Home / Self Care | Payer: 59 | Source: Ambulatory Visit | Attending: Gynecology | Admitting: Gynecology

## 2018-04-15 DIAGNOSIS — Z1231 Encounter for screening mammogram for malignant neoplasm of breast: Secondary | ICD-10-CM | POA: Diagnosis not present

## 2018-06-23 DIAGNOSIS — M9907 Segmental and somatic dysfunction of upper extremity: Secondary | ICD-10-CM | POA: Diagnosis not present

## 2018-06-23 DIAGNOSIS — M75100 Unspecified rotator cuff tear or rupture of unspecified shoulder, not specified as traumatic: Secondary | ICD-10-CM | POA: Diagnosis not present

## 2018-06-23 DIAGNOSIS — M9901 Segmental and somatic dysfunction of cervical region: Secondary | ICD-10-CM | POA: Diagnosis not present

## 2018-06-25 DIAGNOSIS — M9907 Segmental and somatic dysfunction of upper extremity: Secondary | ICD-10-CM | POA: Diagnosis not present

## 2018-06-25 DIAGNOSIS — M9901 Segmental and somatic dysfunction of cervical region: Secondary | ICD-10-CM | POA: Diagnosis not present

## 2018-06-25 DIAGNOSIS — M75100 Unspecified rotator cuff tear or rupture of unspecified shoulder, not specified as traumatic: Secondary | ICD-10-CM | POA: Diagnosis not present

## 2018-06-29 DIAGNOSIS — M9907 Segmental and somatic dysfunction of upper extremity: Secondary | ICD-10-CM | POA: Diagnosis not present

## 2018-06-29 DIAGNOSIS — M9901 Segmental and somatic dysfunction of cervical region: Secondary | ICD-10-CM | POA: Diagnosis not present

## 2018-06-29 DIAGNOSIS — M75100 Unspecified rotator cuff tear or rupture of unspecified shoulder, not specified as traumatic: Secondary | ICD-10-CM | POA: Diagnosis not present

## 2018-07-01 DIAGNOSIS — M9907 Segmental and somatic dysfunction of upper extremity: Secondary | ICD-10-CM | POA: Diagnosis not present

## 2018-07-01 DIAGNOSIS — M9901 Segmental and somatic dysfunction of cervical region: Secondary | ICD-10-CM | POA: Diagnosis not present

## 2018-07-01 DIAGNOSIS — M75100 Unspecified rotator cuff tear or rupture of unspecified shoulder, not specified as traumatic: Secondary | ICD-10-CM | POA: Diagnosis not present

## 2018-07-06 DIAGNOSIS — M25512 Pain in left shoulder: Secondary | ICD-10-CM | POA: Diagnosis not present

## 2018-07-06 DIAGNOSIS — M542 Cervicalgia: Secondary | ICD-10-CM | POA: Diagnosis not present

## 2018-07-08 DIAGNOSIS — M25612 Stiffness of left shoulder, not elsewhere classified: Secondary | ICD-10-CM | POA: Diagnosis not present

## 2018-07-08 DIAGNOSIS — M7502 Adhesive capsulitis of left shoulder: Secondary | ICD-10-CM | POA: Diagnosis not present

## 2018-07-08 DIAGNOSIS — M25512 Pain in left shoulder: Secondary | ICD-10-CM | POA: Diagnosis not present

## 2018-07-12 DIAGNOSIS — M25612 Stiffness of left shoulder, not elsewhere classified: Secondary | ICD-10-CM | POA: Diagnosis not present

## 2018-07-12 DIAGNOSIS — M25512 Pain in left shoulder: Secondary | ICD-10-CM | POA: Diagnosis not present

## 2018-07-12 DIAGNOSIS — M7502 Adhesive capsulitis of left shoulder: Secondary | ICD-10-CM | POA: Diagnosis not present

## 2018-07-14 DIAGNOSIS — M25612 Stiffness of left shoulder, not elsewhere classified: Secondary | ICD-10-CM | POA: Diagnosis not present

## 2018-07-14 DIAGNOSIS — M7502 Adhesive capsulitis of left shoulder: Secondary | ICD-10-CM | POA: Diagnosis not present

## 2018-07-14 DIAGNOSIS — M9901 Segmental and somatic dysfunction of cervical region: Secondary | ICD-10-CM | POA: Diagnosis not present

## 2018-07-14 DIAGNOSIS — M9907 Segmental and somatic dysfunction of upper extremity: Secondary | ICD-10-CM | POA: Diagnosis not present

## 2018-07-14 DIAGNOSIS — M75100 Unspecified rotator cuff tear or rupture of unspecified shoulder, not specified as traumatic: Secondary | ICD-10-CM | POA: Diagnosis not present

## 2018-07-14 DIAGNOSIS — M25512 Pain in left shoulder: Secondary | ICD-10-CM | POA: Diagnosis not present

## 2018-07-19 DIAGNOSIS — M25612 Stiffness of left shoulder, not elsewhere classified: Secondary | ICD-10-CM | POA: Diagnosis not present

## 2018-07-19 DIAGNOSIS — M7502 Adhesive capsulitis of left shoulder: Secondary | ICD-10-CM | POA: Diagnosis not present

## 2018-07-19 DIAGNOSIS — M25512 Pain in left shoulder: Secondary | ICD-10-CM | POA: Diagnosis not present

## 2018-07-21 DIAGNOSIS — M9901 Segmental and somatic dysfunction of cervical region: Secondary | ICD-10-CM | POA: Diagnosis not present

## 2018-07-21 DIAGNOSIS — M9907 Segmental and somatic dysfunction of upper extremity: Secondary | ICD-10-CM | POA: Diagnosis not present

## 2018-07-21 DIAGNOSIS — M75100 Unspecified rotator cuff tear or rupture of unspecified shoulder, not specified as traumatic: Secondary | ICD-10-CM | POA: Diagnosis not present

## 2018-07-22 DIAGNOSIS — M25612 Stiffness of left shoulder, not elsewhere classified: Secondary | ICD-10-CM | POA: Diagnosis not present

## 2018-07-22 DIAGNOSIS — M25512 Pain in left shoulder: Secondary | ICD-10-CM | POA: Diagnosis not present

## 2018-07-22 DIAGNOSIS — M7502 Adhesive capsulitis of left shoulder: Secondary | ICD-10-CM | POA: Diagnosis not present

## 2018-07-27 DIAGNOSIS — M25512 Pain in left shoulder: Secondary | ICD-10-CM | POA: Diagnosis not present

## 2018-07-27 DIAGNOSIS — M25612 Stiffness of left shoulder, not elsewhere classified: Secondary | ICD-10-CM | POA: Diagnosis not present

## 2018-07-27 DIAGNOSIS — M7502 Adhesive capsulitis of left shoulder: Secondary | ICD-10-CM | POA: Diagnosis not present

## 2018-07-28 DIAGNOSIS — M9907 Segmental and somatic dysfunction of upper extremity: Secondary | ICD-10-CM | POA: Diagnosis not present

## 2018-07-28 DIAGNOSIS — M75100 Unspecified rotator cuff tear or rupture of unspecified shoulder, not specified as traumatic: Secondary | ICD-10-CM | POA: Diagnosis not present

## 2018-07-28 DIAGNOSIS — M9901 Segmental and somatic dysfunction of cervical region: Secondary | ICD-10-CM | POA: Diagnosis not present

## 2018-07-28 DIAGNOSIS — E039 Hypothyroidism, unspecified: Secondary | ICD-10-CM | POA: Diagnosis not present

## 2018-07-29 DIAGNOSIS — M7502 Adhesive capsulitis of left shoulder: Secondary | ICD-10-CM | POA: Diagnosis not present

## 2018-07-29 DIAGNOSIS — M25612 Stiffness of left shoulder, not elsewhere classified: Secondary | ICD-10-CM | POA: Diagnosis not present

## 2018-07-29 DIAGNOSIS — M25512 Pain in left shoulder: Secondary | ICD-10-CM | POA: Diagnosis not present

## 2018-08-03 DIAGNOSIS — M7502 Adhesive capsulitis of left shoulder: Secondary | ICD-10-CM | POA: Diagnosis not present

## 2018-08-03 DIAGNOSIS — M25512 Pain in left shoulder: Secondary | ICD-10-CM | POA: Diagnosis not present

## 2018-08-03 DIAGNOSIS — M25612 Stiffness of left shoulder, not elsewhere classified: Secondary | ICD-10-CM | POA: Diagnosis not present

## 2018-08-04 DIAGNOSIS — M9907 Segmental and somatic dysfunction of upper extremity: Secondary | ICD-10-CM | POA: Diagnosis not present

## 2018-08-04 DIAGNOSIS — M9901 Segmental and somatic dysfunction of cervical region: Secondary | ICD-10-CM | POA: Diagnosis not present

## 2018-08-04 DIAGNOSIS — M75100 Unspecified rotator cuff tear or rupture of unspecified shoulder, not specified as traumatic: Secondary | ICD-10-CM | POA: Diagnosis not present

## 2018-08-05 DIAGNOSIS — M25612 Stiffness of left shoulder, not elsewhere classified: Secondary | ICD-10-CM | POA: Diagnosis not present

## 2018-08-05 DIAGNOSIS — M25512 Pain in left shoulder: Secondary | ICD-10-CM | POA: Diagnosis not present

## 2018-08-05 DIAGNOSIS — M7502 Adhesive capsulitis of left shoulder: Secondary | ICD-10-CM | POA: Diagnosis not present

## 2018-08-12 DIAGNOSIS — M25612 Stiffness of left shoulder, not elsewhere classified: Secondary | ICD-10-CM | POA: Diagnosis not present

## 2018-08-12 DIAGNOSIS — M7502 Adhesive capsulitis of left shoulder: Secondary | ICD-10-CM | POA: Diagnosis not present

## 2018-08-12 DIAGNOSIS — M25512 Pain in left shoulder: Secondary | ICD-10-CM | POA: Diagnosis not present

## 2018-12-21 ENCOUNTER — Encounter: Payer: Self-pay | Admitting: Gynecology

## 2020-04-17 ENCOUNTER — Other Ambulatory Visit: Payer: Self-pay | Admitting: Obstetrics & Gynecology

## 2020-04-17 DIAGNOSIS — Z0000 Encounter for general adult medical examination without abnormal findings: Secondary | ICD-10-CM

## 2020-05-03 ENCOUNTER — Ambulatory Visit: Payer: 59 | Admitting: Podiatry

## 2020-05-10 ENCOUNTER — Ambulatory Visit (INDEPENDENT_AMBULATORY_CARE_PROVIDER_SITE_OTHER): Payer: 59

## 2020-05-10 ENCOUNTER — Encounter: Payer: Self-pay | Admitting: Podiatry

## 2020-05-10 ENCOUNTER — Ambulatory Visit (INDEPENDENT_AMBULATORY_CARE_PROVIDER_SITE_OTHER): Payer: 59 | Admitting: Podiatry

## 2020-05-10 ENCOUNTER — Other Ambulatory Visit: Payer: Self-pay

## 2020-05-10 DIAGNOSIS — M67472 Ganglion, left ankle and foot: Secondary | ICD-10-CM

## 2020-05-10 DIAGNOSIS — M778 Other enthesopathies, not elsewhere classified: Secondary | ICD-10-CM

## 2020-05-11 ENCOUNTER — Other Ambulatory Visit: Payer: Self-pay | Admitting: Podiatry

## 2020-05-11 DIAGNOSIS — M67472 Ganglion, left ankle and foot: Secondary | ICD-10-CM

## 2020-05-12 NOTE — Progress Notes (Signed)
Subjective:  Patient ID: Chloe Hanson, female    DOB: 04-12-1970,  MRN: [id number] HPI Chief Complaint  Patient presents with  . Foot Injury    1st MPJ left - dropped cast iron skillet plate on foot x 2 months ago, now very sore and developed a lump to the side, prone to cysts, UC xrayed-brought disc, Rx'd naproxen  . New Patient (Initial Visit)    50 y.o. female presents with the above complaint.   ROS: Denies fever chills nausea vomiting muscle aches pains calf pain back pain chest pain shortness of breath.  Past Medical History:  Diagnosis Date  . ADD (attention deficit disorder)   . Gall bladder inflammation   . Ovarian cyst   . Shingles    Past Surgical History:  Procedure Laterality Date  . CESAREAN SECTION  2000    Current Outpatient Medications:  .  ALPRAZolam (XANAX) 0.25 MG tablet, Take 0.25 mg by mouth at bedtime as needed for anxiety., Disp: , Rfl:  .  amphetamine-dextroamphetamine (ADDERALL) 15 MG tablet, Take 15 mg by mouth daily., Disp: , Rfl:  .  amphetamine-dextroamphetamine (ADDERALL) 20 MG tablet, Take 20 mg by mouth daily., Disp: , Rfl:  .  levothyroxine (SYNTHROID) 25 MCG tablet, Take 25 mcg by mouth daily., Disp: , Rfl:  .  ValACYclovir HCl (VALTREX PO), Take by mouth., Disp: , Rfl:  .  Vitamin D, Ergocalciferol, (DRISDOL) 1.25 MG (50000 UNIT) CAPS capsule, Take 50,000 Units by mouth once a week., Disp: , Rfl:   Allergies  Allergen Reactions  . Latex Itching and Rash    Redness and rash with latex use   Review of Systems Objective:  There were no vitals filed for this visit.  General: Well developed, nourished, in no acute distress, alert and oriented x3   Dermatological: Skin is warm, dry and supple bilateral. Nails x 10 are well maintained; remaining integument appears unremarkable at this time. There are no open sores, no preulcerative lesions, no rash or signs of infection present.  Large nonpulsatile fluctuant lesion overlying the dorsal  aspect of the first metatarsophalangeal joint appears to be a ganglion on palpation.  The cyst was aspirated today demonstrating typical ganglion type material.  Vascular: Dorsalis Pedis artery and Posterior Tibial artery pedal pulses are 2/4 bilateral with immedate capillary fill time. Pedal hair growth present. No varicosities and no lower extremity edema present bilateral.   Neruologic: Grossly intact via light touch bilateral. Vibratory intact via tuning fork bilateral. Protective threshold with Semmes Wienstein monofilament intact to all pedal sites bilateral. Patellar and Achilles deep tendon reflexes 2+ bilateral. No Babinski or clonus noted bilateral.   Musculoskeletal: No gross boney pedal deformities bilateral. No pain, crepitus, or limitation noted with foot and ankle range of motion bilateral. Muscular strength 5/5 in all groups tested bilateral.  Gait: Unassisted, Nonantalgic.    Radiographs:  Radiographs were reviewed from a disc that the patient brought with him demonstrating soft tissue swelling overlying the first metatarsophalangeal joint but no fractures to the first metatarsophalangeal joint or the hallux no other acute findings are noted.  Foot is sitting rectus.  Assessment & Plan:   Assessment: Ganglion cyst first metatarsophalangeal joint left.  Plan: Aspiration was performed today after local anesthetic was administered and the area was prepped and draped in normal sterile fashion.  A total of 1.1 cc of typical clear gelatinous material was removed and disposed of.  Placed Band-Aid and a dry sterile compressive dressing I will follow-up with  her in the near future and we did discuss the need for possible excision.     Max T. [place], [place]

## 2020-05-25 ENCOUNTER — Ambulatory Visit (INDEPENDENT_AMBULATORY_CARE_PROVIDER_SITE_OTHER): Payer: 59 | Admitting: Obstetrics & Gynecology

## 2020-05-25 ENCOUNTER — Encounter: Payer: Self-pay | Admitting: Obstetrics & Gynecology

## 2020-05-25 ENCOUNTER — Other Ambulatory Visit: Payer: Self-pay

## 2020-05-25 ENCOUNTER — Ambulatory Visit: Payer: 59 | Admitting: Obstetrics & Gynecology

## 2020-05-25 VITALS — BP 130/80 | Ht 64.0 in | Wt 144.6 lb

## 2020-05-25 DIAGNOSIS — Z01419 Encounter for gynecological examination (general) (routine) without abnormal findings: Secondary | ICD-10-CM | POA: Diagnosis not present

## 2020-05-25 DIAGNOSIS — Z789 Other specified health status: Secondary | ICD-10-CM | POA: Diagnosis not present

## 2020-05-25 DIAGNOSIS — Z780 Asymptomatic menopausal state: Secondary | ICD-10-CM

## 2020-05-25 NOTE — Progress Notes (Signed)
Chloe Hanson 09/16/1970 [id number]   History:    50 y.o. G3P1A2L1  Married.  Son is 50 yo (I delivered him by C/S)  RP:  New (>3 years) patient presenting for annual gyn exam   HPI: Postmenopause on no HRT.  No PMB.  H/O Endometrial Ablation.  Recurrence of hot flushes recently. Hypothyroidism on Synthroid under reevaluation currently with Fam MD. No pelvic pain.  No pain with IC.  Urine/BMs normal.  Breasts normal.  Refer to [facility] for [employer]  BMI 24.82. Good fitness and nutrition.    Past medical history,surgical history, family history and social history were all reviewed and documented in the EPIC chart.  Gynecologic History No LMP recorded. Patient is perimenopausal.  Obstetric History OB History  Gravida Para Term Preterm AB Living  3 1     2 1   SAB IAB Ectopic Multiple Live Births  1 1          # Outcome Date GA Lbr Len/2nd Weight Sex Delivery Anes PTL Lv  3 IAB           2 SAB           1 Para              ROS: A ROS was performed and pertinent positives and negatives are included in the history.  GENERAL: No fevers or chills. HEENT: No change in vision, no earache, sore throat or sinus congestion. NECK: No pain or stiffness. CARDIOVASCULAR: No chest pain or pressure. No palpitations. PULMONARY: No shortness of breath, cough or wheeze. GASTROINTESTINAL: No abdominal pain, nausea, vomiting or diarrhea, melena or bright red blood per rectum. GENITOURINARY: No urinary frequency, urgency, hesitancy or dysuria. MUSCULOSKELETAL: No joint or muscle pain, no back pain, no recent trauma. DERMATOLOGIC: No rash, no itching, no lesions. ENDOCRINE: No polyuria, polydipsia, no heat or cold intolerance. No recent change in weight. HEMATOLOGICAL: No anemia or easy bruising or bleeding. NEUROLOGIC: No headache, seizures, numbness, tingling or weakness. PSYCHIATRIC: No depression, no loss of interest in normal activity or change in sleep pattern.     Exam:   BP 130/80   Ht 5\' 4"   (1.626 m)   Wt 144 lb 9.6 oz (65.6 kg)   BMI 24.82 kg/m   Body mass index is 24.82 kg/m.  General appearance : Well developed well nourished female. No acute distress HEENT: Eyes: no retinal hemorrhage or exudates,  Neck supple, trachea midline, no carotid bruits, no thyroidmegaly Lungs: Clear to auscultation, no rhonchi or wheezes, or rib retractions  Heart: Regular rate and rhythm, no murmurs or gallops Breast:Examined in sitting and supine position were symmetrical in appearance, no palpable masses or tenderness,  no skin retraction, no nipple inversion, no nipple discharge, no skin discoloration, no axillary or supraclavicular lymphadenopathy Abdomen: no palpable masses or tenderness, no rebound or guarding Extremities: no edema or skin discoloration or tenderness  Pelvic: Vulva: Normal             Vagina: No gross lesions or discharge  Cervix: No gross lesions or discharge.  Pap reflex done.  Uterus AV, normal size, shape and consistency, non-tender and mobile  Adnexa  Without masses or tenderness  Anus: Normal   Assessment/Plan:  50 y.o. female for annual exam   1. Encounter for routine gynecological examination with Papanicolaou smear of cervix Normal gynecologic exam.  Pap reflex done.  Breast exam normal.  Will schedule a screening mammogram now.  Refer to gastro for colonoscopy.  Good body mass index at 24.82.  Continue with fitness and healthy nutrition.  Adjusting Synthroid for hypothyroidism with family nurse practitioner currently.  Health labs with family nurse practitioner. - Pap IG w/ reflex to HPV when ASC-U  2. Postmenopause On no HRT.  Mild hot flushes currently.  If symptoms persist after adjusting Synthroid dosage, will f/u to discuss HRT.  3. Uses condoms for birth control Recommend condom use for contraception.  S/P Endometrial ablation.  Princess Bruins MD, 1:26 PM 05/25/2020

## 2020-05-29 LAB — PAP IG W/ RFLX HPV ASCU

## 2020-05-30 ENCOUNTER — Other Ambulatory Visit: Payer: Self-pay

## 2020-05-30 ENCOUNTER — Ambulatory Visit
Admission: RE | Admit: 2020-05-30 | Discharge: 2020-05-30 | Disposition: A | Discharge status: Home / Self Care | Payer: 59 | Source: Ambulatory Visit | Attending: Obstetrics & Gynecology | Admitting: Obstetrics & Gynecology

## 2020-05-30 DIAGNOSIS — Z0000 Encounter for general adult medical examination without abnormal findings: Secondary | ICD-10-CM

## 2021-05-28 ENCOUNTER — Encounter: Payer: Self-pay | Admitting: Obstetrics & Gynecology

## 2021-05-28 ENCOUNTER — Other Ambulatory Visit: Payer: Self-pay

## 2021-05-28 ENCOUNTER — Ambulatory Visit (INDEPENDENT_AMBULATORY_CARE_PROVIDER_SITE_OTHER): Payer: 59 | Admitting: Obstetrics & Gynecology

## 2021-05-28 VITALS — BP 110/78 | HR 70 | Resp 16 | Ht 63.25 in | Wt 151.0 lb

## 2021-05-28 DIAGNOSIS — R5383 Other fatigue: Secondary | ICD-10-CM | POA: Diagnosis not present

## 2021-05-28 DIAGNOSIS — N951 Menopausal and female climacteric states: Secondary | ICD-10-CM

## 2021-05-28 DIAGNOSIS — E039 Hypothyroidism, unspecified: Secondary | ICD-10-CM

## 2021-05-28 DIAGNOSIS — Z01419 Encounter for gynecological examination (general) (routine) without abnormal findings: Secondary | ICD-10-CM | POA: Diagnosis not present

## 2021-05-28 NOTE — Progress Notes (Signed)
Chloe Hanson 14-Sep-1970 [id number]   History:    51 y.o. G3P1A2L1  Married.  Son is 2 yo (I delivered him by C/S)   RP:  Established patient presenting for annual gyn exam    HPI: Postmenopause on no HRT.  No PMB.  Last FSH was borderline at 29.6 in 2016. H/O Endometrial Ablation.  Continued hot flushes.  C/O fatigue with depressive Sxs, no suicidal ideations, and insomnia x about 3 months. Vaginal dryness and painful IC.  Pap Neg 05/2020.  No pelvic pain. Hypothyroidism on Synthroid for a while, stopped by Fam MD last year.  Urine/BMs normal. Breasts normal.  Mammo Neg 05/2020, will schedule. Will call Gastro for [place].  BMI 26.54. Good fitness and nutrition.     Past medical history,surgical history, family history and social history were all reviewed and documented in the EPIC chart.  Gynecologic History No LMP recorded. Patient is postmenopausal.  Obstetric History OB History  Gravida Para Term Preterm AB Living  3 1     2 1   SAB IAB Ectopic Multiple Live Births  1 1          # Outcome Date GA Lbr Len/2nd Weight Sex Delivery Anes PTL Lv  3 IAB           2 SAB           1 Para              ROS: A ROS was performed and pertinent positives and negatives are included in the history.  GENERAL: No fevers or chills. HEENT: No change in vision, no earache, sore throat or sinus congestion. NECK: No pain or stiffness. CARDIOVASCULAR: No chest pain or pressure. No palpitations. PULMONARY: No shortness of breath, cough or wheeze. GASTROINTESTINAL: No abdominal pain, nausea, vomiting or diarrhea, melena or bright red blood per rectum. GENITOURINARY: No urinary frequency, urgency, hesitancy or dysuria. MUSCULOSKELETAL: No joint or muscle pain, no back pain, no recent trauma. DERMATOLOGIC: No rash, no itching, no lesions. ENDOCRINE: No polyuria, polydipsia, no heat or cold intolerance. No recent change in weight. HEMATOLOGICAL: No anemia or easy bruising or bleeding. NEUROLOGIC: No  headache, seizures, numbness, tingling or weakness. PSYCHIATRIC: No depression, no loss of interest in normal activity or change in sleep pattern.     Exam:   BP 110/78    Pulse 70    Resp 16    Ht 5' 3.25" (1.607 m)    Wt 151 lb (68.5 kg)    BMI 26.54 kg/m   Body mass index is 26.54 kg/m.  General appearance : Well developed well nourished female. No acute distress HEENT: Eyes: no retinal hemorrhage or exudates,  Neck supple, trachea midline, no carotid bruits, no thyroidmegaly Lungs: Clear to auscultation, no rhonchi or wheezes, or rib retractions  Heart: Regular rate and rhythm, no murmurs or gallops Breast:Examined in sitting and supine position were symmetrical in appearance, no palpable masses or tenderness,  no skin retraction, no nipple inversion, no nipple discharge, no skin discoloration, no axillary or supraclavicular lymphadenopathy Abdomen: no palpable masses or tenderness, no rebound or guarding Extremities: no edema or skin discoloration or tenderness  Pelvic: Vulva: Normal             Vagina: No gross lesions or discharge  Cervix: No gross lesions or discharge  Uterus  AV, normal size, shape and consistency, non-tender and mobile  Adnexa  Without masses or tenderness  Anus: Normal   Assessment/Plan:  51  y.o. female for annual exam   1. Well female exam with routine gynecological exam Postmenopause on no HRT.  No PMB. Last [place] borderline at 29.6 in 2016. H/O Endometrial Ablation.  Continued hot flushes.  C/O fatigue with depressive Sxs, no suicidal ideations, and insomnia x about 3 months. Vaginal dryness and painful IC.  Pap Neg 05/2020.  No pelvic pain. Hypothyroidism on Synthroid for a while, stopped by Fam MD last year.  Urine/BMs normal.  Breasts normal.  Mammo Neg 05/2020, will schedule. Will call Gastro for [place].  BMI 26.54. Good fitness and nutrition.    2. Menopausal symptoms Postmenopause on no HRT.  No PMB. Last [place] borderline at 29.6 in 2016. H/O  Endometrial Ablation.  Continued hot flushes.  C/O fatigue with depressive Sxs, no suicidal ideations, and insomnia x about 3 months. Vaginal dryness and painful IC.  Very likely symptomatic menopause.  Repeat [place] today.  Offered counseling on HRT per results. - FSH  3. Acquired hypothyroidism Not currently treated, will check TSH to decide on management. - TSH  4. Fatigue Fatigue with insomnia and depressive Sxs.  Menopausal Sxs and h/o Hypothyroidism.  Patient's fatigue is probably multifactorial. Recommend investigating and managing insomnia and depression through Fam MD. - FSH - TSH  Other orders - ALPRAZolam (XANAX) 1 MG tablet; Take 1 mg by mouth 3 (three) times daily as needed.   Princess Bruins MD, 2:45 PM 05/28/2021

## 2021-05-29 ENCOUNTER — Encounter: Payer: Self-pay | Admitting: Obstetrics & Gynecology

## 2021-05-29 LAB — FOLLICLE STIMULATING HORMONE: FSH: 76.9 m[IU]/mL

## 2021-05-29 LAB — TSH: TSH: 2.61 mIU/L

## 2021-08-19 ENCOUNTER — Ambulatory Visit
Admission: RE | Admit: 2021-08-19 | Discharge: 2021-08-19 | Disposition: A | Discharge status: Home / Self Care | Payer: 59 | Source: Ambulatory Visit | Attending: Obstetrics & Gynecology | Admitting: Obstetrics & Gynecology

## 2021-08-19 ENCOUNTER — Other Ambulatory Visit: Payer: Self-pay | Admitting: Obstetrics & Gynecology

## 2021-08-19 ENCOUNTER — Telehealth: Payer: Self-pay

## 2021-08-19 DIAGNOSIS — Z1231 Encounter for screening mammogram for malignant neoplasm of breast: Secondary | ICD-10-CM

## 2021-08-19 NOTE — Telephone Encounter (Signed)
Patient had AEX 05/28/21 and Dr. Marguerita Merles wrote in office note "Counseling on HRT done and decision to wait on lab results before deciding on treatment."  06/02/2021 [place] high at 76.9 Menopause. TSH normal.  Patient called in triage voice mail because she is ready for HRT and wants to know what she needs to do to proceed with that.

## 2021-08-22 ENCOUNTER — Other Ambulatory Visit: Payer: Self-pay | Admitting: Obstetrics & Gynecology

## 2021-08-22 DIAGNOSIS — R928 Other abnormal and inconclusive findings on diagnostic imaging of breast: Secondary | ICD-10-CM

## 2021-08-22 NOTE — Telephone Encounter (Signed)
I called patient and left message in voice mail that Dr. Marguerita Merles recommended visit to discuss options.  Asked her to call back to schedule or if any questions.

## 2021-08-22 NOTE — Telephone Encounter (Signed)
Princess Bruins, MD  You 5 minutes ago (4:14 PM)   I recommend a visit to give her all options and counseling.

## 2021-09-04 ENCOUNTER — Ambulatory Visit
Admission: RE | Admit: 2021-09-04 | Discharge: 2021-09-04 | Disposition: A | Discharge status: Home / Self Care | Payer: 59 | Source: Ambulatory Visit | Attending: Obstetrics & Gynecology | Admitting: Obstetrics & Gynecology

## 2021-09-04 DIAGNOSIS — R928 Other abnormal and inconclusive findings on diagnostic imaging of breast: Secondary | ICD-10-CM

## 2022-08-21 ENCOUNTER — Other Ambulatory Visit (HOSPITAL_COMMUNITY)
Admission: RE | Admit: 2022-08-21 | Discharge: 2022-08-21 | Disposition: A | Discharge status: Home / Self Care | Payer: 59 | Source: Ambulatory Visit | Attending: Obstetrics & Gynecology | Admitting: Obstetrics & Gynecology

## 2022-08-21 ENCOUNTER — Ambulatory Visit (INDEPENDENT_AMBULATORY_CARE_PROVIDER_SITE_OTHER): Payer: 59 | Admitting: Obstetrics & Gynecology

## 2022-08-21 ENCOUNTER — Encounter: Payer: Self-pay | Admitting: Obstetrics & Gynecology

## 2022-08-21 VITALS — BP 112/74 | HR 85 | Ht 63.75 in | Wt 148.0 lb

## 2022-08-21 DIAGNOSIS — N951 Menopausal and female climacteric states: Secondary | ICD-10-CM

## 2022-08-21 DIAGNOSIS — Z01419 Encounter for gynecological examination (general) (routine) without abnormal findings: Secondary | ICD-10-CM | POA: Insufficient documentation

## 2022-08-21 MED ORDER — PROGESTERONE MICRONIZED 100 MG PO CAPS: 100.0000 mg | ORAL_CAPSULE | Freq: Every day | ORAL | 4 refills | Status: DC

## 2022-08-21 MED ORDER — ESTRADIOL 0.05 MG/24HR TD PTTW: 1.0000 | MEDICATED_PATCH | TRANSDERMAL | 4 refills | Status: DC

## 2022-08-21 NOTE — Progress Notes (Signed)
Chloe Hanson Apr 13, 1970 [id number]   History:    52 y.o.  G3P1A2L1  Married.  Son is 22 yo (I delivered him by C/S)   RP:  Established patient presenting for annual gyn exam    HPI: Postmenopause on no HRT, would like to start given hot flushes and fatigue with insomnia.  No PMB. H/O Endometrial Ablation.  Vaginal dryness and painful IC.  Pap Neg 05/2020. Pap reflex today. No pelvic pain. Urine/BMs normal. Breasts normal.  Mammo Lt Neg 07/2021, Rt Dx mammo/US 08/2021.  Schedule mammo now.  Will call Gastro for [place].  BMI 25.6. Good fitness, rowing and walking her dog and nutrition.     PAP: 05-25-20, MMG: bilateral 08-19-21& rt breast u/s 09-04-21     Past medical history,surgical history, family history and social history were all reviewed and documented in the EPIC chart.  Gynecologic History Patient's last menstrual period was 04/13/2017.  Obstetric History OB History  Gravida Para Term Preterm AB Living  3 1     2 1   SAB IAB Ectopic Multiple Live Births  1 1          # Outcome Date GA Lbr Len/2nd Weight Sex Delivery Anes PTL Lv  3 IAB           2 SAB           1 Para              ROS: A ROS was performed and pertinent positives and negatives are included in the history. GENERAL: No fevers or chills. HEENT: No change in vision, no earache, sore throat or sinus congestion. NECK: No pain or stiffness. CARDIOVASCULAR: No chest pain or pressure. No palpitations. PULMONARY: No shortness of breath, cough or wheeze. GASTROINTESTINAL: No abdominal pain, nausea, vomiting or diarrhea, melena or bright red blood per rectum. GENITOURINARY: No urinary frequency, urgency, hesitancy or dysuria. MUSCULOSKELETAL: No joint or muscle pain, no back pain, no recent trauma. DERMATOLOGIC: No rash, no itching, no lesions. ENDOCRINE: No polyuria, polydipsia, no heat or cold intolerance. No recent change in weight. HEMATOLOGICAL: No anemia or easy bruising or bleeding. NEUROLOGIC: No headache, seizures,  numbness, tingling or weakness. PSYCHIATRIC: No depression, no loss of interest in normal activity or change in sleep pattern.     Exam:   BP 112/74   Pulse 85   Ht 5' 3.75" (1.619 m)   Wt 148 lb (67.1 kg)   LMP 04/13/2017 Comment: sexually active  SpO2 99%   BMI 25.60 kg/m   Body mass index is 25.6 kg/m.  General appearance : Well developed well nourished female. No acute distress HEENT: Eyes: no retinal hemorrhage or exudates,  Neck supple, trachea midline, no carotid bruits, no thyroidmegaly Lungs: Clear to auscultation, no rhonchi or wheezes, or rib retractions  Heart: Regular rate and rhythm, no murmurs or gallops Breast:Examined in sitting and supine position were symmetrical in appearance, no palpable masses or tenderness,  no skin retraction, no nipple inversion, no nipple discharge, no skin discoloration, no axillary or supraclavicular lymphadenopathy Abdomen: no palpable masses or tenderness, no rebound or guarding Extremities: no edema or skin discoloration or tenderness  Pelvic: Vulva: Normal             Vagina: No gross lesions or discharge  Cervix: No gross lesions or discharge.  Pap reflex done.  Uterus  AV, normal size, shape and consistency, non-tender and mobile  Adnexa  Without masses or tenderness  Anus: Normal  Assessment/Plan:  52 y.o. female for annual exam   1. Encounter for routine gynecological examination with Papanicolaou smear of cervix Postmenopause on no HRT, would like to start given hot flushes and fatigue with insomnia.  No PMB. H/O Endometrial Ablation.  Vaginal dryness and painful IC.  Pap Neg 05/2020. Pap reflex today. No pelvic pain. Urine/BMs normal. Breasts normal.  Mammo Lt Neg 07/2021, Rt Dx mammo/US 08/2021.  Schedule mammo now.  Will call Gastro for [place].  BMI 25.6. Good fitness, rowing and walking her dog and nutrition.   - Cytology - PAP( [place])  2. Menopausal symptoms Postmenopause on no HRT, would like to start given hot  flushes and fatigue with insomnia.  No PMB. H/O Endometrial Ablation.  Vaginal dryness and painful IC.  Counseling on HRT done.  No CI.  Decision to start on Estradiol patch 0.05 twice a week and Progesterone 100 mg 1 caps PO HS.  Prescriptions sent to pharmacy.  Other orders - valACYclovir (VALTREX) 1000 MG tablet; Take 1 tablet by mouth as needed. - estradiol (VIVELLE-DOT) 0.05 MG/24HR patch; Place 1 patch (0.05 mg total) onto the skin 2 (two) times a week. - progesterone (PROMETRIUM) 100 MG capsule; Take 1 capsule (100 mg total) by mouth at bedtime.   Genia Del MD, 11:41 AM

## 2022-08-27 LAB — CYTOLOGY - PAP
Adequacy: ABSENT
Diagnosis: NEGATIVE

## 2022-10-09 ENCOUNTER — Encounter: Payer: Self-pay | Admitting: Allergy

## 2022-10-09 ENCOUNTER — Ambulatory Visit (INDEPENDENT_AMBULATORY_CARE_PROVIDER_SITE_OTHER): Payer: 59 | Admitting: Allergy

## 2022-10-09 VITALS — BP 122/76 | HR 81 | Temp 98.5°F | Resp 18 | Ht 64.9 in | Wt 150.2 lb

## 2022-10-09 DIAGNOSIS — R609 Edema, unspecified: Secondary | ICD-10-CM | POA: Insufficient documentation

## 2022-10-09 DIAGNOSIS — J3089 Other allergic rhinitis: Secondary | ICD-10-CM | POA: Insufficient documentation

## 2022-10-09 MED ORDER — RYALTRIS 665-25 MCG/ACT NA SUSP: 1.0000 | Freq: Two times a day (BID) | NASAL | 5 refills | Status: DC

## 2022-10-09 NOTE — Patient Instructions (Addendum)
Unable to skin test today as [facility] won't allow new patient visits and procedures on the same day.  Return for allergy testing - no allergy medications for 3 days before.  Rhinitis  Use over the counter antihistamines such as Zyrtec (cetirizine), Claritin (loratadine), Allegra (fexofenadine), or Xyzal (levocetirizine) daily as needed. May take twice a day during allergy flares. May switch antihistamines every few months. Start Ryaltris (olopatadine + mometasone nasal spray combination) 1-2 sprays per nostril twice a day. Sample given. This replaces your other nasal sprays. If this works well for you, then have [facility] ship the medication to your home - prescription already sent in.  Nasal saline spray (i.e., Simply Saline) or nasal saline lavage (i.e., NeilMed) is recommended as needed and prior to medicated nasal sprays.  Swelling Food allergies typically do not cause the type of swelling you describe. Salty foods and high carb foods can make you retain fluids. Please follow up with your PCP regarding this.  Follow up for allergy testing.

## 2022-10-09 NOTE — Progress Notes (Signed)
New Patient Note  RE: Chloe Hanson MRN: [id number] DOB: [birth year] Date of Office Visit: 10/09/2022  Consult requested by: Marva Panda, NP Primary care provider: Marva Panda, NP  Chief Complaint: Allergy Testing  History of Present Illness: I had the pleasure of seeing Chloe Hanson for initial evaluation at the [facility] on 10/09/2022. She is a 52 y.o. female, who is referred here by Marva Panda, NP for the evaluation of allergies.  Environmental:  She reports symptoms of headaches, PND, sore throat, nasal congestion, itchy/watery eyes. Symptoms have been going on for 30+ years. The symptoms are present all year around with worsening in fall. Anosmia: no. Headache: yes. She has used Flonase, zyrtec with some improvement in symptoms. Sinus infections: 2 per year. Previous work up includes: none. Previous ENT evaluation: no and prior sinus surgery. Previous sinus imaging: no. History of nasal polyps: no. Last eye exam: this year. History of reflux: denies - used to have issues in the 20s.  Other Patient noted "inflammation" for the past 4 years. She noticed swelling in her feet and her legs feel tighter. She can feel that her rings are tighter on her hands, facial swelling.  She usually has these issues about 3 days per week. She can happen throughout the day and time of the year does not matter either.  Sometimes feeling tired and lack of energy. No associated rash with this.  No prior work up. Patient does not eat very salty foods.  Denies any cardiac issues.   Dietary History: patient has been eating other foods including milk, eggs, peanut, treenuts, sesame, shellfish, fish, soy, wheat, meats, fruits and vegetables.  Assessment and Plan: Chloe Hanson is a 52 y.o. female with: Other allergic rhinitis Perennial rhinoconjunctivitis symptoms for 30+ years and waking up with headaches in the mornings.  Tried Flonase and Zyrtec with some  benefit.  Usually gets 2 sinus infections per year.  No prior allergy evaluation. Unable to skin test today as [facility] won't allow new patient visits and procedures on the same day.  Return for allergy testing - will make additional recommendations based on results. Use over the counter antihistamines such as Zyrtec (cetirizine), Claritin (loratadine), Allegra (fexofenadine), or Xyzal (levocetirizine) daily as needed. May take twice a day during allergy flares. May switch antihistamines every few months. Start Ryaltris (olopatadine + mometasone nasal spray combination) 1-2 sprays per nostril twice a day. Sample given. This replaces your other nasal sprays. If this works well for you, then have [facility] ship the medication to your home - prescription already sent in.  Nasal saline spray (i.e., Simply Saline) or nasal saline lavage (i.e., NeilMed) is recommended as needed and prior to medicated nasal sprays.  Swelling Noted "inflammation" x 4 years and describes this more of a feet, hand and facial swelling. Usually has 3 episodes per week and can happen anytime during the day. No associated, rash or pruritus. Eats a fairly healthy diet. Denies cardiac issues. Patient concerned if she's eating anything that's causing this.  Discussed with patient that skin prick testing and bloodwork (food IgE levels) check for IgE mediated reactions which her clinical presentation does not support.  Salty foods and high carb foods can make you retain fluids. Please follow up with PCP regarding this. Keep track of episodes. Sometimes I do an angioedema work up for swelling via bloodwork. Will discuss more at next visit.   Return for Skin testing.  Meds ordered this encounter  Medications  Olopatadine-Mometasone (RYALTRIS) X543819 MCG/ACT SUSP    Sig: Place 1-2 sprays into the nose in the morning and at bedtime.    Dispense:  29 g    Refill:  5    [phone]   Lab Orders  No laboratory test(s) ordered today     Other allergy screening: Asthma: no Medication allergy: no Hymenoptera allergy: no Urticaria: no Eczema:no History of recurrent infections suggestive of immunodeficency: no  Diagnostics: Unable to skin test today as [facility] won't allow new patient visits and procedures on the same day.    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     amphetamine-dextroamphetamine (ADDERALL) 20 MG tablet Take 10 mg by mouth daily.     estradiol (VIVELLE-DOT) 0.05 MG/24HR patch Place 1 patch (0.05 mg total) onto the skin 2 (two) times a week. 24 patch 4   levothyroxine (SYNTHROID) 25 MCG tablet Take 25 mcg by mouth daily.     Olopatadine-Mometasone (RYALTRIS) X543819 MCG/ACT SUSP Place 1-2 sprays into the nose in the morning and at bedtime. 29 g 5   progesterone (PROMETRIUM) 100 MG capsule Take 1 capsule (100 mg total) by mouth at bedtime. 90 capsule 4   valACYclovir (VALTREX) 1000 MG tablet Take 1 tablet by mouth as needed.     No current facility-administered medications for this visit.   Allergies: Allergies  Allergen Reactions   Latex Itching and Rash    Redness and rash with latex use   Social History: Social History   Socioeconomic History   Marital status:  Married    Spouse name: Not on file   Number of children: Not on file   Years of education: Not on file   Highest education level: Not on file  Occupational History   Not on file  Tobacco Use   Smoking status: Never   Smokeless tobacco: Never  Vaping Use   Vaping status: Never Used  Substance and Sexual Activity   Alcohol use: Yes    Comment: occ   Drug use: No   Sexual activity: Yes    Birth control/protection: Post-menopausal    Comment: 1st intercourse 52 yo-More than 5 partners  Other Topics Concern   Not on file  Social History Narrative   Not on file   Social Determinants of Health   Financial Resource Strain: Not on file  Food Insecurity: Not on file  Transportation Needs: Not on file  Physical Activity: Not on file  Stress: Not on file  Social Connections: Not on file   Lives in a house. Smoking: denies Occupation: facilities site Art therapist HistorySurveyor, minerals in the house: no Carpet in the family room: yes Carpet in the bedroom: no Heating: electric and gas Cooling: central Pet: yes  1 dog - stays mainly outdoors, 1 cats outdoors.   Family History: Family History  Problem Relation Age of Onset   Allergic rhinitis Mother    Breast cancer Mother 70   Allergic rhinitis Father    Heart disease Father    Cancer Father        Skin   Allergic rhinitis Sister    Cancer Sister        leiomyosarcoma, in the breast   Allergic rhinitis Sister    Asthma Neg Hx    Eczema Neg Hx    Urticaria Neg Hx    Review of Systems  Constitutional:  Negative for appetite change, chills, fever and unexpected weight change.  HENT:  Positive for congestion, postnasal drip and rhinorrhea.   Eyes:  Positive for itching.  Respiratory:  Negative for cough, chest tightness, shortness of breath and wheezing.   Cardiovascular:  Negative for chest pain.  Gastrointestinal:  Negative for abdominal pain.  Genitourinary:  Negative for difficulty urinating.   Skin:  Negative for rash.  Neurological:  Positive for headaches.    Objective: BP 122/76 (BP Location: Right Arm, Patient Position: Sitting, Cuff Size: Normal)   Pulse 81   Temp 98.5 F (36.9 C) (Temporal)   Resp 18   Ht 5' 4.9" (1.648 m)   Wt 150 lb 4 oz (68.2 kg)   LMP 04/13/2017 Comment: sexually active  SpO2 96%   BMI 25.08 kg/m  Body mass index is 25.08 kg/m. Physical Exam Vitals and nursing note reviewed.  Constitutional:      Appearance: Normal appearance. She is well-developed.  HENT:     Head: Normocephalic and atraumatic.     Right Ear: Tympanic membrane and external ear normal.     Left Ear: Tympanic membrane and external ear normal.     Nose: Nose normal.     Mouth/Throat:     Mouth: Mucous membranes are moist.     Pharynx: Oropharynx is clear.  Eyes:     Conjunctiva/sclera: Conjunctivae normal.  Cardiovascular:     Rate and Rhythm: Normal rate and regular rhythm.     Heart sounds: Normal heart sounds. No murmur heard.    No friction rub. No gallop.  Pulmonary:     Effort: Pulmonary effort is normal.     Breath sounds: Normal breath sounds. No wheezing, rhonchi or rales.  Musculoskeletal:     Cervical back: Neck supple.  Skin:    General: Skin is warm.     Findings: No rash.  Neurological:     Mental Status: She is alert and oriented to person, place, and time.  Psychiatric:        Behavior: Behavior normal.   The plan was reviewed with the patient/family, and all questions/concerned were addressed.  It was my pleasure to see Chloe Hanson today and participate in her care. Please feel free to contact me with any questions or concerns.  Sincerely,  Wyline Mood, DO Allergy & Immunology  [facility] office: [phone] [facility] office: [phone]

## 2022-10-09 NOTE — Assessment & Plan Note (Signed)
Perennial rhinoconjunctivitis symptoms for 30+ years and waking up with headaches in the mornings.  Tried Flonase and Zyrtec with some benefit.  Usually gets 2 sinus infections per year.  No prior allergy evaluation. Unable to skin test today as [facility] won't allow new patient visits and procedures on the same day.  Return for allergy testing - will make additional recommendations based on results. Use over the counter antihistamines such as Zyrtec (cetirizine), Claritin (loratadine), Allegra (fexofenadine), or Xyzal (levocetirizine) daily as needed. May take twice a day during allergy flares. May switch antihistamines every few months. Start Ryaltris (olopatadine + mometasone nasal spray combination) 1-2 sprays per nostril twice a day. Sample given. This replaces your other nasal sprays. If this works well for you, then have [facility] ship the medication to your home - prescription already sent in.  Nasal saline spray (i.e., Simply Saline) or nasal saline lavage (i.e., NeilMed) is recommended as needed and prior to medicated nasal sprays.

## 2022-10-09 NOTE — Assessment & Plan Note (Addendum)
Noted "inflammation" x 4 years and describes this more of a feet, hand and facial swelling. Usually has 3 episodes per week and can happen anytime during the day. No associated, rash or pruritus. Eats a fairly healthy diet. Denies cardiac issues. Patient concerned if she's eating anything that's causing this.  Discussed with patient that skin prick testing and bloodwork (food IgE levels) check for IgE mediated reactions which her clinical presentation does not support.  Salty foods and high carb foods can make you retain fluids. Please follow up with PCP regarding this. Keep track of episodes. Sometimes I do an angioedema work up for swelling via bloodwork. Will discuss more at next visit.

## 2022-10-29 NOTE — Progress Notes (Signed)
Follow Up Note  RE: Chloe Hanson MRN: [id number] DOB: [birth year] Date of Office Visit: 10/30/2022  Referring provider: Elie Confer, NP Primary care provider: Elie Confer, NP  Chief Complaint: Allergy Testing  History of Present Illness: I had the pleasure of seeing Chloe Hanson for a follow up visit at the [facility] on 10/31/2022. She is a 52 y.o. female, who is being followed for allergic rhinitis and swelling. Her previous allergy office visit was on 10/09/2022 with Dr. Selena Batten. Today is a skin testing and follow up visit.  Environmental allergies Worse symptoms since off antihistamines. Ryaltris helps.  Swelling Worse when eating gluten products.  Didn't see PCP yet.   Assessment and Plan: Chloe Hanson is a 52 y.o. female with:  Chronic rhinitis Past history - Perennial rhinoconjunctivitis symptoms for 30+ years and waking up with headaches in the mornings. Tried Flonase and Zyrtec with some benefit. Usually gets 2 sinus infections per year.  Today's skin prick testing showed: Negative to indoor/outdoor allergens and common foods.  Declined intradermal testing and prefers bloodwork. Will make additional recommendations based on results.  Use over the counter antihistamines such as Zyrtec (cetirizine), Claritin (loratadine), Allegra (fexofenadine), or Xyzal (levocetirizine) daily as needed. May take twice a day during allergy flares. May switch antihistamines every few months. Continue Ryaltris (olopatadine + mometasone nasal spray combination) 1-2 sprays per nostril twice a day.  Nasal saline spray (i.e., Simply Saline) or nasal saline lavage (i.e., NeilMed) is recommended as needed and prior to medicated nasal sprays.  Swelling Food allergies typically do not cause the type of swelling you describe. Salty foods and high carb foods (gluten) can make you retain fluids. Please follow up with your PCP regarding this.  Return if symptoms worsen or  fail to improve.  No orders of the defined types were placed in this encounter.  Lab Orders         Allergens w/Total IgE Area 2      Diagnostics: Skin Testing: Deferred due to recent antihistamines use. Negative to indoor/outdoor allergens and common foods.  Results discussed with patient/family.  Airborne Adult Perc - 10/30/22 1526     Time Antigen Placed 1515    Allergen Manufacturer Waynette Buttery    Location Back    Number of Test 55    1. Control-Buffer 50% Glycerol Negative    2. Control-Histamine 3+    3. Bahia Negative    4. French Southern Territories Negative    5. Johnson Negative    6. Kentucky Blue Negative    7. Meadow Fescue Negative    8. Perennial Rye Negative    9. Timothy Negative    10. Ragweed Mix Negative    11. Cocklebur Negative    12. Plantain,  English Negative    13. Baccharis Negative    14. Dog Fennel Negative    15. Russian Thistle Negative    16. Lamb's Quarters Negative    17. Sheep Sorrell Negative    18. Rough Pigweed Negative    19. Marsh Elder, Rough Negative    20. Mugwort, Common Negative    21. Box, Elder Negative    22. Cedar, red Negative    23. Sweet Gum Negative    24. Pecan Pollen Negative    25. Pine Mix Negative    26. Walnut, Black Pollen Negative    27. Red Mulberry Negative    28. Ash Mix Negative    29. Birch Mix Negative  30. Beech American Negative    31. Cottonwood, Guinea-Bissau Negative    32. Hickory, White Negative    33. Maple Mix Negative    34. Oak, Guinea-Bissau Mix Negative    35. Sycamore Eastern Negative    36. Alternaria Alternata Negative    37. Cladosporium Herbarum Negative    38. Aspergillus Mix Negative    39. Penicillium Mix Negative    40. Bipolaris Sorokiniana (Helminthosporium) Negative    41. Drechslera Spicifera (Curvularia) Negative    42. Mucor Plumbeus Negative    43. Fusarium Moniliforme Negative    44. Aureobasidium Pullulans (pullulara) Negative    45. Rhizopus Oryzae Negative    46. Botrytis Cinera Negative     47. Epicoccum Nigrum Negative    48. Phoma Betae Negative    49. Dust Mite Mix Negative    50. Cat Hair 10,000 BAU/ml Negative    51.  Dog Epithelia Negative    52. Mixed Feathers Negative    53. Horse Epithelia Negative    54. Cockroach, German Negative    55. Tobacco Leaf Negative             Food Adult Perc - 10/30/22 1500     Time Antigen Placed 1526    Allergen Manufacturer Waynette Buttery    Location Back    Number of allergen test 9    1. Peanut Negative    2. Soybean Negative    3. Wheat Negative    4. Sesame Negative    5. Milk, Cow Negative    6. Casein Negative    7. Egg White, Chicken Negative    8. Shellfish Mix Negative    9. Fish Mix Negative             Medication List:  Current Outpatient Medications  Medication Sig Dispense Refill   ALPRAZolam (XANAX) 1 MG tablet Take 1 mg by mouth 3 (three) times daily as needed.     amphetamine-dextroamphetamine (ADDERALL) 20 MG tablet Take 10 mg by mouth daily.     estradiol (VIVELLE-DOT) 0.05 MG/24HR patch Place 1 patch (0.05 mg total) onto the skin 2 (two) times a week. 24 patch 4   Olopatadine-Mometasone (RYALTRIS) 665-25 MCG/ACT SUSP Place 1-2 sprays into the nose in the morning and at bedtime. 29 g 5   progesterone (PROMETRIUM) 100 MG capsule Take 1 capsule (100 mg total) by mouth at bedtime. 90 capsule 4   valACYclovir (VALTREX) 1000 MG tablet Take 1 tablet by mouth as needed.     levothyroxine (SYNTHROID) 25 MCG tablet Take 25 mcg by mouth daily. (Patient not taking: Reported on 10/30/2022)     No current facility-administered medications for this visit.   Allergies: Allergies  Allergen Reactions   Latex Itching and Rash    Redness and rash with latex use   I reviewed her past medical history, social history, family history, and environmental history and no significant changes have been reported from her previous visit.  Review of Systems  Constitutional:  Negative for appetite change, chills, fever and  unexpected weight change.  HENT:  Positive for congestion, postnasal drip and rhinorrhea.   Eyes:  Negative for itching.  Respiratory:  Negative for cough, chest tightness, shortness of breath and wheezing.   Cardiovascular:  Negative for chest pain.  Gastrointestinal:  Negative for abdominal pain.  Genitourinary:  Negative for difficulty urinating.  Skin:  Negative for rash.  Allergic/Immunologic: Negative for food allergies.  Neurological:  Positive for headaches.  Objective: BP 122/76   Pulse 80   Temp 98.3 F (36.8 C)   Resp 18   Wt 150 lb 8 oz (68.3 kg)   LMP 04/13/2017 Comment: sexually active  SpO2 96%   BMI 25.12 kg/m  Body mass index is 25.12 kg/m. Physical Exam Vitals and nursing note reviewed.  Constitutional:      Appearance: Normal appearance. She is well-developed.  HENT:     Head: Normocephalic and atraumatic.     Right Ear: Tympanic membrane and external ear normal.     Left Ear: Tympanic membrane and external ear normal.     Nose: Nose normal.     Mouth/Throat:     Mouth: Mucous membranes are moist.     Pharynx: Oropharynx is clear.  Eyes:     Conjunctiva/sclera: Conjunctivae normal.  Cardiovascular:     Rate and Rhythm: Normal rate and regular rhythm.     Heart sounds: Normal heart sounds. No murmur heard.    No friction rub. No gallop.  Pulmonary:     Effort: Pulmonary effort is normal.     Breath sounds: Normal breath sounds. No wheezing, rhonchi or rales.  Musculoskeletal:     Cervical back: Neck supple.  Skin:    General: Skin is warm.     Findings: No rash.  Neurological:     Mental Status: She is alert and oriented to person, place, and time.  Psychiatric:        Behavior: Behavior normal.   Previous notes and tests were reviewed. The plan was reviewed with the patient/family, and all questions/concerned were addressed.  It was my pleasure to see Aleane today and participate in her care. Please feel free to contact me with any  questions or concerns.  Sincerely,  Wyline Mood, DO Allergy & Immunology  [facility] office: [phone] [facility] office: [phone]

## 2022-10-30 ENCOUNTER — Other Ambulatory Visit: Payer: Self-pay

## 2022-10-30 ENCOUNTER — Ambulatory Visit (INDEPENDENT_AMBULATORY_CARE_PROVIDER_SITE_OTHER): Payer: 59 | Admitting: Allergy

## 2022-10-30 ENCOUNTER — Encounter: Payer: Self-pay | Admitting: Allergy

## 2022-10-30 VITALS — BP 122/76 | HR 80 | Temp 98.3°F | Resp 18 | Wt 150.5 lb

## 2022-10-30 DIAGNOSIS — J3089 Other allergic rhinitis: Secondary | ICD-10-CM

## 2022-10-30 DIAGNOSIS — R609 Edema, unspecified: Secondary | ICD-10-CM

## 2022-10-30 DIAGNOSIS — J310 Chronic rhinitis: Secondary | ICD-10-CM | POA: Diagnosis not present

## 2022-10-30 NOTE — Patient Instructions (Addendum)
Today's skin prick testing showed: Negative to indoor/outdoor allergens and common foods.   Results given.  Rhinitis  Get bloodwork and will make additional recommendations based on results.  We are ordering labs, so please allow 1-2 weeks for the results to come back. With the newly implemented Cures Act, the labs might be visible to you at the same time that they become visible to me. However, I will not address the results until all of the results are back, so please be patient.  In the meantime, continue recommendations in your patient instructions, including avoidance measures (if applicable), until you hear from me. Use over the counter antihistamines such as Zyrtec (cetirizine), Claritin (loratadine), Allegra (fexofenadine), or Xyzal (levocetirizine) daily as needed. May take twice a day during allergy flares. May switch antihistamines every few months. Continue Ryaltris (olopatadine + mometasone nasal spray combination) 1-2 sprays per nostril twice a day.  If this works well for you, then have [facility] ship the medication to your home - prescription already sent in.  Nasal saline spray (i.e., Simply Saline) or nasal saline lavage (i.e., NeilMed) is recommended as needed and prior to medicated nasal sprays.  Swelling Food allergies typically do not cause the type of swelling you describe. Salty foods and high carb foods (gluten) can make you retain fluids. Please follow up with your PCP regarding this.  Follow up as needed.

## 2022-10-31 ENCOUNTER — Encounter: Payer: Self-pay | Admitting: Allergy

## 2022-12-31 ENCOUNTER — Ambulatory Visit: Payer: 59 | Attending: Cardiology | Admitting: Cardiology

## 2022-12-31 ENCOUNTER — Ambulatory Visit: Payer: 59

## 2022-12-31 ENCOUNTER — Encounter: Payer: Self-pay | Admitting: Cardiology

## 2022-12-31 VITALS — BP 122/70 | HR 86 | Resp 16 | Ht 64.0 in | Wt 152.6 lb

## 2022-12-31 DIAGNOSIS — R400 Somnolence: Secondary | ICD-10-CM | POA: Insufficient documentation

## 2022-12-31 DIAGNOSIS — Z8249 Family history of ischemic heart disease and other diseases of the circulatory system: Secondary | ICD-10-CM | POA: Diagnosis not present

## 2022-12-31 DIAGNOSIS — R002 Palpitations: Secondary | ICD-10-CM | POA: Diagnosis not present

## 2022-12-31 NOTE — Progress Notes (Signed)
  Cardiology Office Note:  .   Date:  12/31/2022  ID:  Chloe Hanson, DOB [birth year], MRN [id number] PCP: Elie Confer, NP  [facility] Providers Cardiologist:  Truett Mainland, MD PCP: Elie Confer, NP  Chief Complaint:  Chief Complaint  Patient presents with   Palpitations   Dizziness   New Patient (Initial Visit)    Referred by Bill Salinas, NP      History of Present Illness: .   Chloe Hanson is a 52 y.o. female with family history of CAD, referred for palpitations and lightheadedness.  Patient works as a Systems analyst, has to do a lot of walking at work.  She denies any chest pain shortness of breath activity.  1 month ago, she had an episode of lightheadedness on standing up, without any palpitations.  She wears a smart watch that did not alert her about any arrhythmias.  However, there are times when her heart rate is elevated while sitting, as alerted by her smart watch.  Separately, patient reports episodes of "hitting a brick wall" in the middle of the day.  She snores occasionally.  Vitals:   12/31/22 0943  BP: 122/70  Pulse: 86  Resp: 16  SpO2: 98%     ROS:  Review of Systems  Cardiovascular:  Negative for chest pain, dyspnea on exertion, leg swelling, palpitations and syncope.  Respiratory:  Positive for snoring (Daytime sleepiness).   Neurological:  Positive for light-headedness.     Studies Reviewed: Marland Kitchen       EKG 12/31/2022: Normal sinus rhythm Septal infarct , age undetermined Nonspecific diffuse ST depression No previous ECGs available    Labs 11/17/2022: No significant abnormality (including normal TSH)   Physical Exam:   Physical Exam Vitals and nursing note reviewed.  Constitutional:      General: She is not in acute distress. Neck:     Vascular: No JVD.  Cardiovascular:     Rate and Rhythm: Normal rate and regular rhythm.     Heart sounds: Normal heart sounds. No murmur heard. Pulmonary:     Effort: Pulmonary  effort is normal.     Breath sounds: Normal breath sounds. No wheezing or rales.  Musculoskeletal:     Right lower leg: No edema.     Left lower leg: No edema.      VISIT DIAGNOSES:   ICD-10-CM   1. Palpitations  R00.2 EKG 12-Lead    2. Daytime sleepiness  R40.0     3. Family history of early CAD  Z82.49        ASSESSMENT AND PLAN: .    Chloe Hanson is a 52 y.o. female with family history of CAD, referred for palpitations and lightheadedness.   Lightheadedness: Orthostatic vitals are negative today.  I still suspect her episode of lightheadedness a month ago could have been related to dehydration.  My concern for arrhythmia is relatively low.  However, given her episodes of rapid heartbeat at rest, I will order a 2-week Zio patch.  Daytime sleepiness, snoring: Suspicious of obstructive sleep apnea.  Recommend sleep study. Confirm history of CAD: Check lipid panel, calcium score scan.  Depending on results of above testing, I will see her as needed    F/u as needed  Signed, Elder Negus, MD

## 2022-12-31 NOTE — Progress Notes (Unsigned)
Enrolled patient for a 14 day Zio XT  monitor to be mailed to patients home  °

## 2022-12-31 NOTE — Patient Instructions (Signed)
Medication Instructions:  *If you need a refill on your cardiac medications before your next appointment, please call your pharmacy*   Lab Work: Lipid   If you have labs (blood work) drawn today and your tests are completely normal, you will receive your results only by: MyChart Message (if you have MyChart) OR A paper copy in the mail If you have any lab test that is abnormal or we need to change your treatment, we will call you to review the results.   Testing/Procedures:  Chloe Hanson- Long Term Monitor Instructions  Your physician has requested you wear a ZIO patch monitor for 14 days.  This is a single patch monitor. [employer] supplies one patch monitor per enrollment. Additional stickers are not available. Please do not apply patch if you will be having a Nuclear Stress Test,  Echocardiogram, Cardiac CT, MRI, or Chest Xray during the period you would be wearing the  monitor. The patch cannot be worn during these tests. You cannot remove and re-apply the  ZIO XT patch monitor.  Your ZIO patch monitor will be mailed 3 day USPS to your address on file. It may take 3-5 days  to receive your monitor after you have been enrolled.  Once you have received your monitor, please review the enclosed instructions. Your monitor  has already been registered assigning a specific monitor serial # to you.  Billing and Patient Assistance Program Information  We have supplied [employer] with any of your insurance information on file for billing purposes. [employer] offers a sliding scale Patient Assistance Program for patients that do not have  insurance, or whose insurance does not completely cover the cost of the ZIO monitor.  You must apply for the Patient Assistance Program to qualify for this discounted rate.  To apply, please call [employer] at [phone], select option 4, select option 2, ask to apply for  Patient Assistance Program. Meredeth Ide will ask your household income, and how many people  are in  your household. They will quote your out-of-pocket cost based on that information.  [employer] will also be able to set up a 60-month, interest-free payment plan if needed.  Applying the monitor   Shave hair from upper left chest.  Hold abrader disc by orange tab. Rub abrader in 40 strokes over the upper left chest as  indicated in your monitor instructions.  Clean area with 4 enclosed alcohol pads. Let dry.  Apply patch as indicated in monitor instructions. Patch will be placed under collarbone on left  side of chest with arrow pointing upward.  Rub patch adhesive wings for 2 minutes. Remove white label marked "1". Remove the white  label marked "2". Rub patch adhesive wings for 2 additional minutes.  While looking in a mirror, press and release button in center of patch. A small green light will  flash 3-4 times. This will be your only indicator that the monitor has been turned on.  Do not shower for the first 24 hours. You may shower after the first 24 hours.  Press the button if you feel a symptom. You will hear a small click. Record Date, Time and  Symptom in the Patient Logbook.  When you are ready to remove the patch, follow instructions on the last 2 pages of Patient  Logbook. Stick patch monitor onto the last page of Patient Logbook.  Place Patient Logbook in the blue and white box. Use locking tab on box and tape box closed  securely. The blue and white box  has prepaid postage on it. Please place it in the mailbox as  soon as possible. Your physician should have your test results approximately 7 days after the  monitor has been mailed back to [facility].  Call [facility] Customer Care at [phone] if you have questions regarding  your ZIO XT patch monitor. Call them immediately if you see an orange light blinking on your  monitor.  If your monitor falls off in less than 4 days, contact our Monitor department at [phone].  If your monitor becomes loose or falls off  after 4 days call [employer] at [phone] for  suggestions on securing your monitor   Calcium Score   Home Sleep Study    Follow-Up: At [facility], you and your health needs are our priority.  As part of our continuing mission to provide you with exceptional heart care, we have created designated Provider Care Teams.  These Care Teams include your primary Cardiologist (physician) and Advanced Practice Providers (APPs -  Physician Assistants and Nurse Practitioners) who all work together to provide you with the care you need, when you need it.  We recommend signing up for the patient portal called "MyChart".  Sign up information is provided on this After Visit Summary.  MyChart is used to connect with patients for Virtual Visits (Telemedicine).  Patients are able to view lab/test results, encounter notes, upcoming appointments, etc.  Non-urgent messages can be sent to your provider as well.   To learn more about what you can do with MyChart, go to [email or web address].    Your next appointment:  as needed   Coronary Calcium Scan A coronary calcium scan is an imaging test used to look for deposits of plaque in the inner lining of the blood vessels of the heart (coronary arteries). Plaque is made up of calcium, protein, and fatty substances. These deposits of plaque can partly clog and narrow the coronary arteries without producing any symptoms or warning signs. This puts a person at risk for a heart attack. A coronary calcium scan is performed using a computed tomography (CT) scanner machine without using a dye (contrast). This test is recommended for people who are at moderate risk for heart disease. The test can find plaque deposits before symptoms develop. Tell a health care provider about: Any allergies you have. All medicines you are taking, including vitamins, herbs, eye drops, creams, and over-the-counter medicines. Any problems you or family members have had with  anesthetic medicines. Any bleeding problems you have. Any surgeries you have had. Any medical conditions you have. Whether you are pregnant or may be pregnant. What are the risks? Generally, this is a safe procedure. However, problems may occur, including: Harm to a pregnant woman and her unborn baby. This test involves the use of radiation. Radiation exposure can be dangerous to a pregnant woman and her unborn baby. If you are pregnant or think you may be pregnant, you should not have this procedure done. A slight increase in the risk of cancer. This is because of the radiation involved in the test. The amount of radiation from one test is similar to the amount of radiation you are naturally exposed to over one year. What happens before the procedure? Ask your health care provider for any specific instructions on how to prepare for this procedure. You may be asked to avoid products that contain caffeine, tobacco, or nicotine for 4 hours before the procedure. What happens during the procedure?  You will undress and remove any  jewelry from your neck or chest. You may need to remove hearing aides and dentures. Women may need to remove their bras. You will put on a hospital gown. Sticky electrodes will be placed on your chest. The electrodes will be connected to an electrocardiogram (ECG) machine to record a tracing of the electrical activity of your heart. You will lie down on your back on a curved bed that is attached to the CT scanner. You may be given medicine to slow down your heart rate so that clear pictures can be created. You will be moved into the CT scanner, and the CT scanner will take pictures of your heart. During this time, you will be asked to lie still and hold your breath for 10-20 seconds at a time while each picture of your heart is being taken. The procedure may vary among health care providers and hospitals. What can I expect after the procedure? You can return to your normal  activities. It is up to you to get the results of your procedure. Ask your health care provider, or the department that is doing the procedure, when your results will be ready. Summary A coronary calcium scan is an imaging test used to look for deposits of plaque in the inner lining of the blood vessels of the heart. Plaque is made up of calcium, protein, and fatty substances. A coronary calcium scan is performed using a CT scanner machine without contrast. Generally, this is a safe procedure. Tell your health care provider if you are pregnant or may be pregnant. Ask your health care provider for any specific instructions on how to prepare for this procedure. You can return to your normal activities after the scan is done. This information is not intended to replace advice given to you by your health care provider. Make sure you discuss any questions you have with your health care provider. Document Revised: 02/24/2021 Document Reviewed: 02/24/2021 Elsevier Patient Education  2024 [employer].

## 2023-01-01 ENCOUNTER — Telehealth: Payer: Self-pay

## 2023-01-01 NOTE — Telephone Encounter (Signed)
**Note De-Identified Chloe Hanson Obfuscation** Ordering provider: Dr Rosemary Holms Associated diagnoses: Snoring-R06.83 and Fatigue-R53.83 Home Sleep Test PA obtained on 01/01/2023 by Isidra Mings, Lorelle Formosa, LPN. Per [facility] Portal-Prior Authorization/Notification is not required for the requested service(s). Decision ID #: [id number]

## 2023-01-02 ENCOUNTER — Other Ambulatory Visit: Payer: Self-pay | Admitting: Cardiology

## 2023-01-02 ENCOUNTER — Ambulatory Visit: Payer: 59 | Attending: Cardiology

## 2023-01-03 LAB — LIPID PANEL
Chol/HDL Ratio: 3.9 {ratio} (ref 0.0–4.4)
Cholesterol, Total: 246 mg/dL — ABNORMAL HIGH (ref 100–199)
HDL: 63 mg/dL (ref >39)
LDL Chol Calc (NIH): 176 mg/dL — ABNORMAL HIGH (ref 0–99)
Triglycerides: 47 mg/dL (ref 0–149)
VLDL Cholesterol Cal: 7 mg/dL (ref 5–40)

## 2023-01-05 ENCOUNTER — Ambulatory Visit (HOSPITAL_COMMUNITY)
Admission: RE | Admit: 2023-01-05 | Discharge: 2023-01-05 | Disposition: A | Discharge status: Home / Self Care | Payer: Self-pay | Source: Ambulatory Visit | Attending: Cardiology | Admitting: Cardiology

## 2023-01-05 DIAGNOSIS — R002 Palpitations: Secondary | ICD-10-CM | POA: Insufficient documentation

## 2023-01-05 DIAGNOSIS — R400 Somnolence: Secondary | ICD-10-CM | POA: Insufficient documentation

## 2023-01-05 DIAGNOSIS — Z8249 Family history of ischemic heart disease and other diseases of the circulatory system: Secondary | ICD-10-CM | POA: Insufficient documentation

## 2023-01-05 NOTE — Progress Notes (Signed)
Cholesterol is quite high. Pending other tests including calcium score scan, given her family history of CAD, I recommend starting statin. If patient okay with it, please send Crestor 20 mg daily.  Thanks MJP

## 2023-01-06 ENCOUNTER — Telehealth: Payer: Self-pay

## 2023-01-06 NOTE — Telephone Encounter (Signed)
-----   Message from [facility] sent at 01/05/2023  2:15 PM EDT ----- Cholesterol is quite high. Pending other tests including calcium score scan, given her family history of CAD, I recommend starting statin. If patient okay with it, please send Crestor 20 mg daily.  Thanks MJP

## 2023-01-06 NOTE — Telephone Encounter (Signed)
Left voicemail to return call to office.

## 2023-08-31 ENCOUNTER — Other Ambulatory Visit: Payer: Self-pay | Admitting: Obstetrics & Gynecology

## 2023-08-31 DIAGNOSIS — N951 Menopausal and female climacteric states: Secondary | ICD-10-CM

## 2023-09-01 NOTE — Telephone Encounter (Signed)
 Med refill request: progesterone  100 mg Last AEX: 08/21/22 Dr. Lavoie Next AEX: none scheduled Last MMG (if hormonal med) 09/04/21 diag uni right BI-RADS 1 negative, 08/19/21 BI-RADS 0 Refill authorized, needs appointment.  Please approve or deny as appropriate.

## 2023-09-02 ENCOUNTER — Other Ambulatory Visit: Payer: Self-pay | Admitting: Obstetrics & Gynecology

## 2023-09-02 ENCOUNTER — Other Ambulatory Visit: Payer: Self-pay | Admitting: Radiology

## 2023-09-02 DIAGNOSIS — N951 Menopausal and female climacteric states: Secondary | ICD-10-CM

## 2023-09-02 NOTE — Telephone Encounter (Signed)
 Med refill request: estradiol  0.05 mg patch.  Patient requests a 90 day supply. Last AEX: 08/01/22 Next AEX: none scheduled Last MMG (if hormonal med) 09/24/21 diag uni right BI-RADS 1 negative, 08/19/21 BI-RADS 0 Refill duplicate.  Sent earlier today for #24.  Needs appointment for further refills.  RF denied.

## 2023-09-02 NOTE — Telephone Encounter (Signed)
 Med refill request: estradiol  0.05 mg patch Last AEX: 08/21/22 Dr. Lavoie Next AEX: none scheduled Last MMG (if hormonal med) 09/04/21 diag unit right BI-RADS 1 negative, 08/19/21 BI-RADS 0 Refill authorized:  Needs appointment. Please approve or deny as appropriate.

## 2024-01-10 IMAGING — MG MM DIGITAL DIAGNOSTIC UNILAT*R* W/ TOMO W/ CAD
6 of 12 series · 6 of 36 positions shown · non-contrast
Comparison: Previous exam(s).

CLINICAL DATA: 51-year-old female presenting as a recall from
screening for possible right breast asymmetry.

EXAM:
DIGITAL DIAGNOSTIC UNILATERAL RIGHT MAMMOGRAM WITH TOMOSYNTHESIS AND
CAD; ULTRASOUND RIGHT BREAST LIMITED
TECHNIQUE: Right digital diagnostic mammography and breast tomosynthesis was
performed. The images were evaluated with computer-aided detection.;
Targeted ultrasound examination of the right breast was performed

[R ML synth-2D]
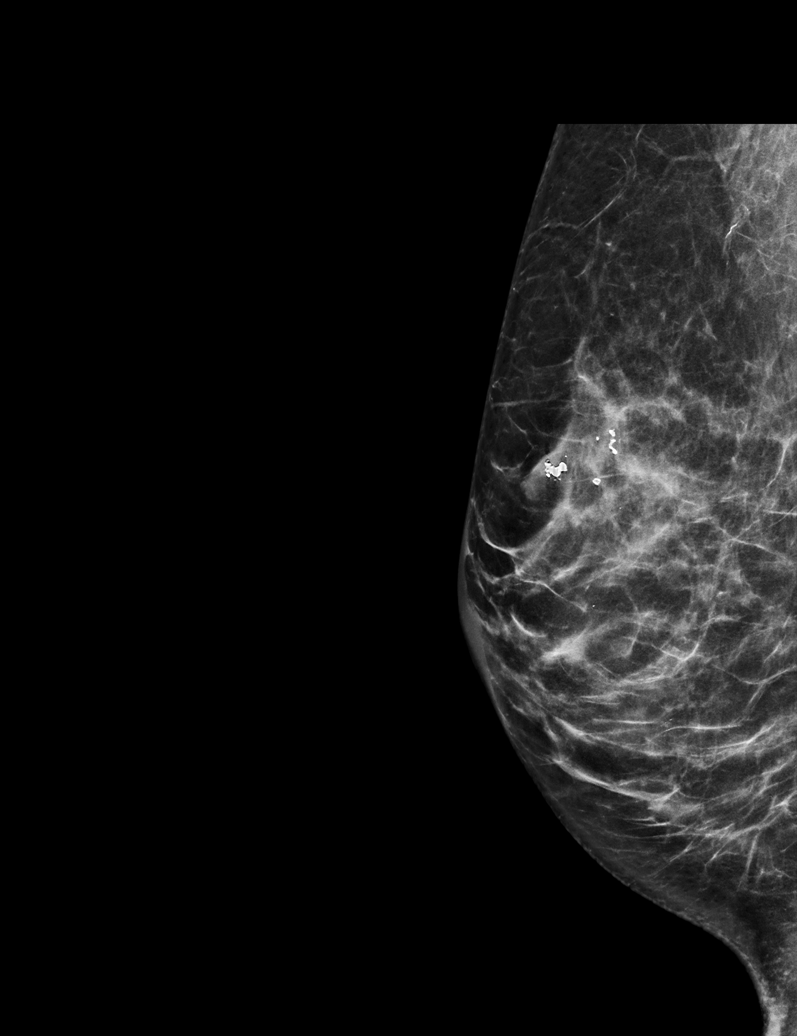

[R CC synth-2D (1 of 4)]
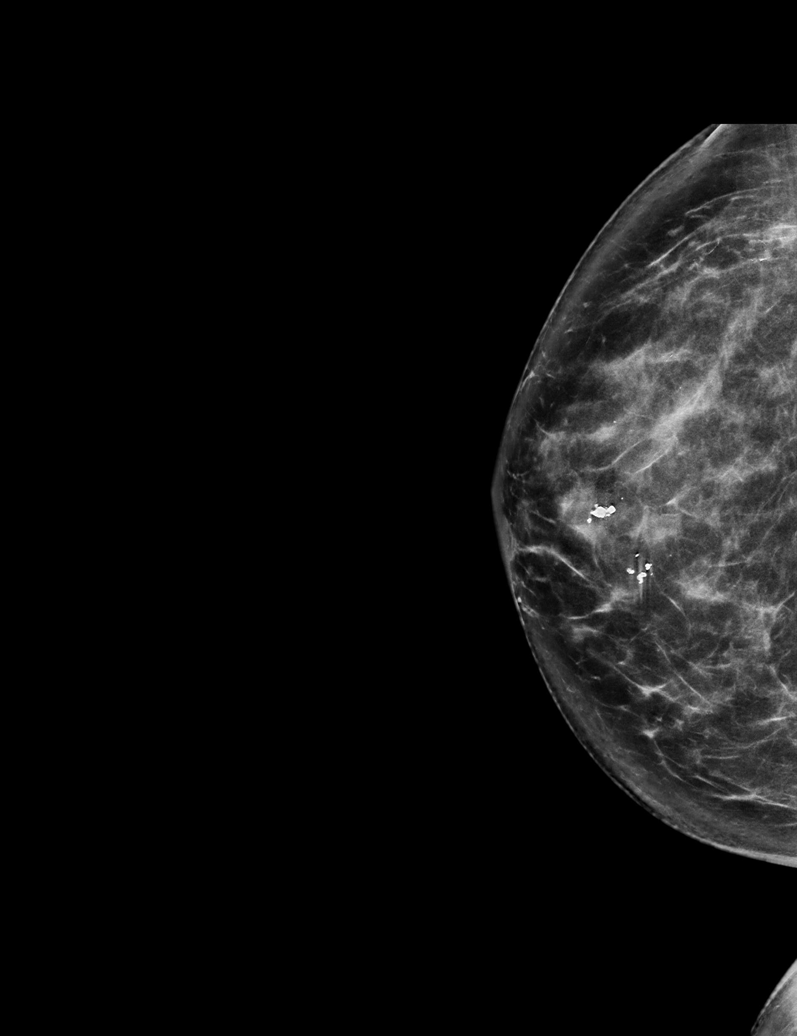

[R CC synth-2D (2 of 4)]
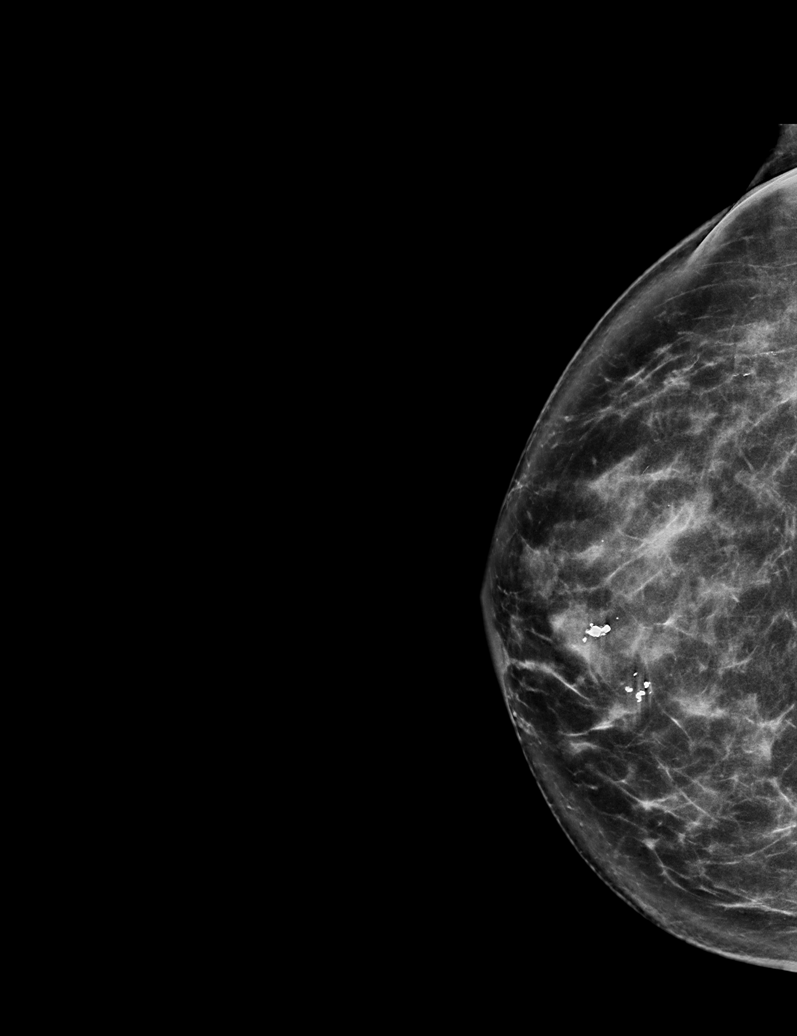

[R CC synth-2D (3 of 4)]
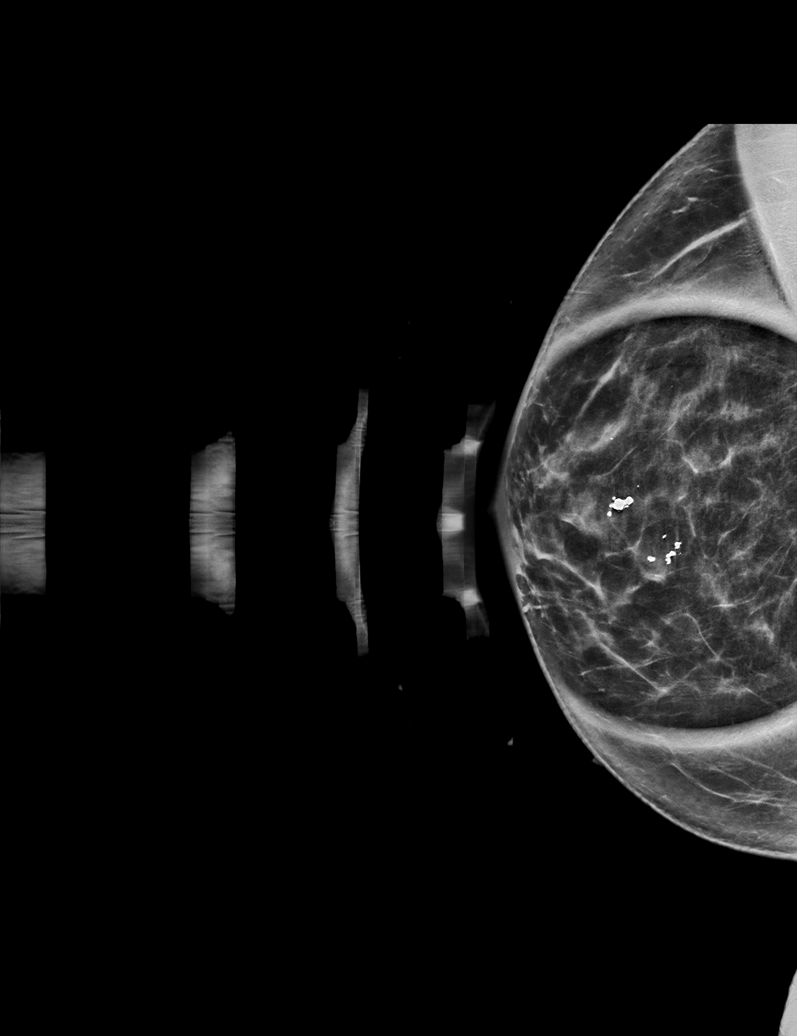

[R MLO synth-2D]
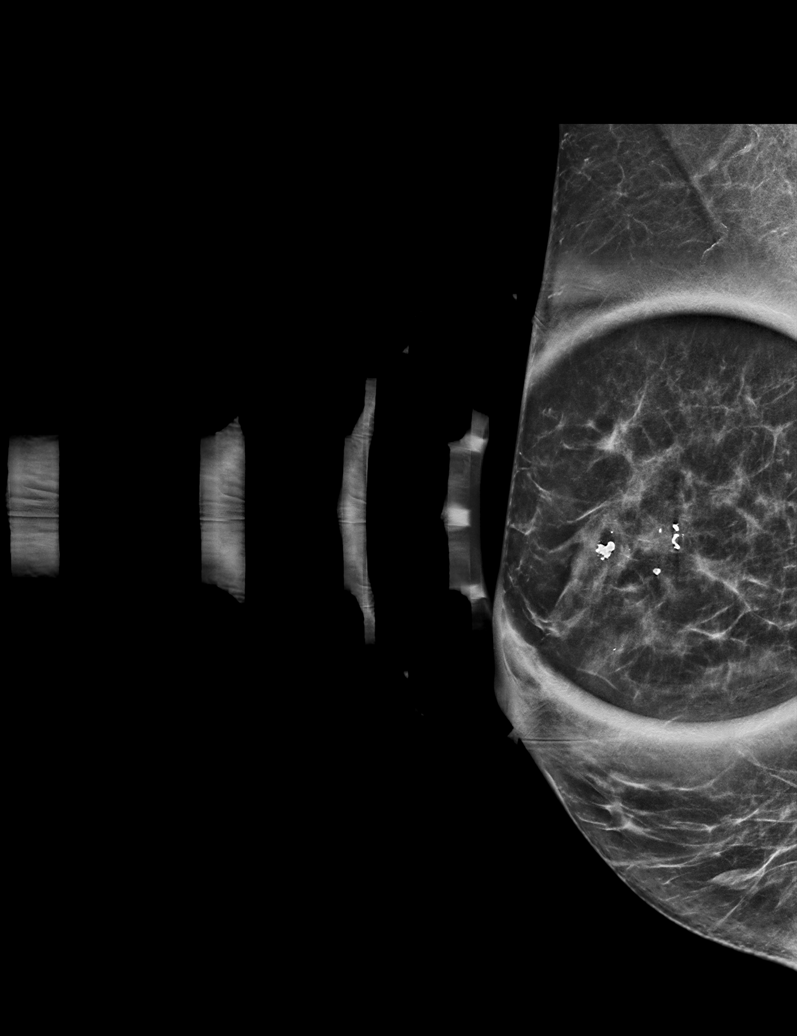

[R CC synth-2D (4 of 4)]
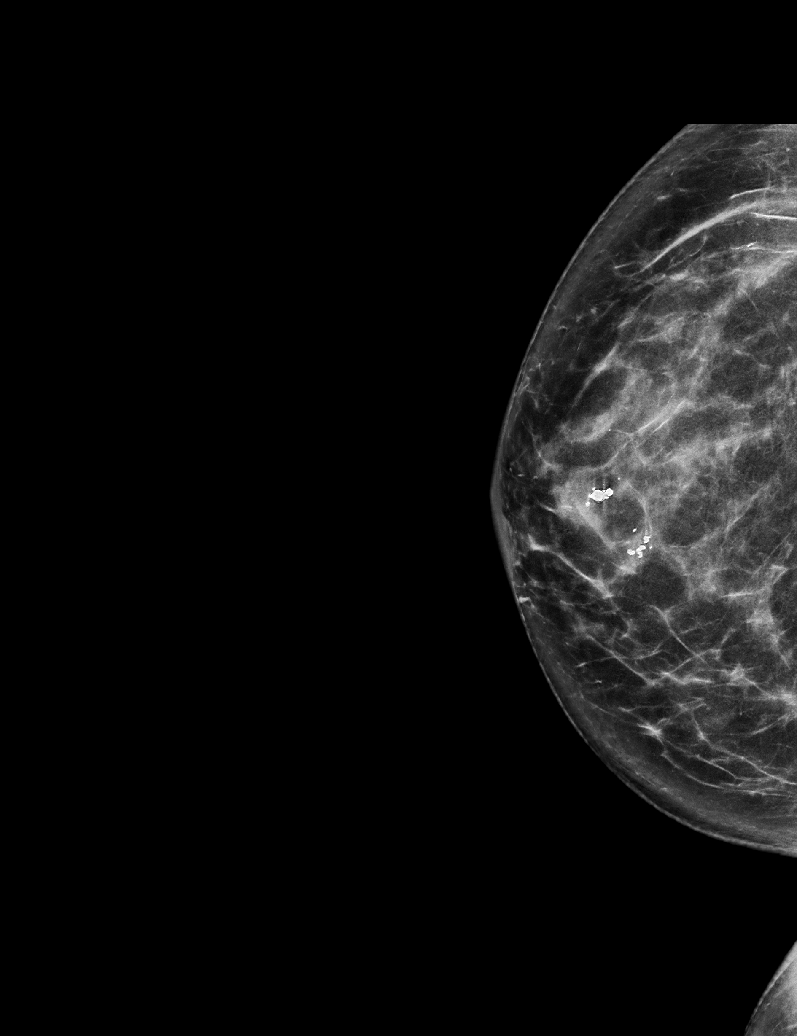

[6 of 36 positions shown; findings below may reference images not displayed]

ACR Breast Density Category c: The breast tissue is heterogeneously
dense, which may obscure small masses.
FINDINGS: Mammogram:

Right breast: Full field cc and rolled cc as well as full true
lateral spot compression tomosynthesis views of the right breast
were performed for a questioned asymmetry best seen on cc view in
the central slightly superior right breast. On the additional
imaging the asymmetry does not persist and most likely represented
normal fibroglandular tissue. There is no mass or distortion. No new
suspicious findings elsewhere in the right breast.

Ultrasound:

Targeted ultrasound performed throughout the superior central aspect
of the right breast demonstrating no cystic or solid mass.
IMPRESSION: No mammographic or sonographic evidence of malignancy in the
superior central right breast.

RECOMMENDATION:
Screening mammogram in one year.(Code:X0-I-LL4)

I have discussed the findings and recommendations with the patient.
If applicable, a reminder letter will be sent to the patient
regarding the next appointment.

BI-RADS CATEGORY  1: Negative.

## 2024-01-10 IMAGING — US US BREAST*R* LIMITED INC AXILLA
1 series · 2 of 2 positions shown · non-contrast
Comparison: Previous exam(s).

CLINICAL DATA: 51-year-old female presenting as a recall from
screening for possible right breast asymmetry.

EXAM:
DIGITAL DIAGNOSTIC UNILATERAL RIGHT MAMMOGRAM WITH TOMOSYNTHESIS AND
CAD; ULTRASOUND RIGHT BREAST LIMITED
TECHNIQUE: Right digital diagnostic mammography and breast tomosynthesis was
performed. The images were evaluated with computer-aided detection.;
Targeted ultrasound examination of the right breast was performed

[Series 2: us breast*right* limited inc axilla · 0.06mm/px · 2 of 2 slices shown]
[im 1/2]
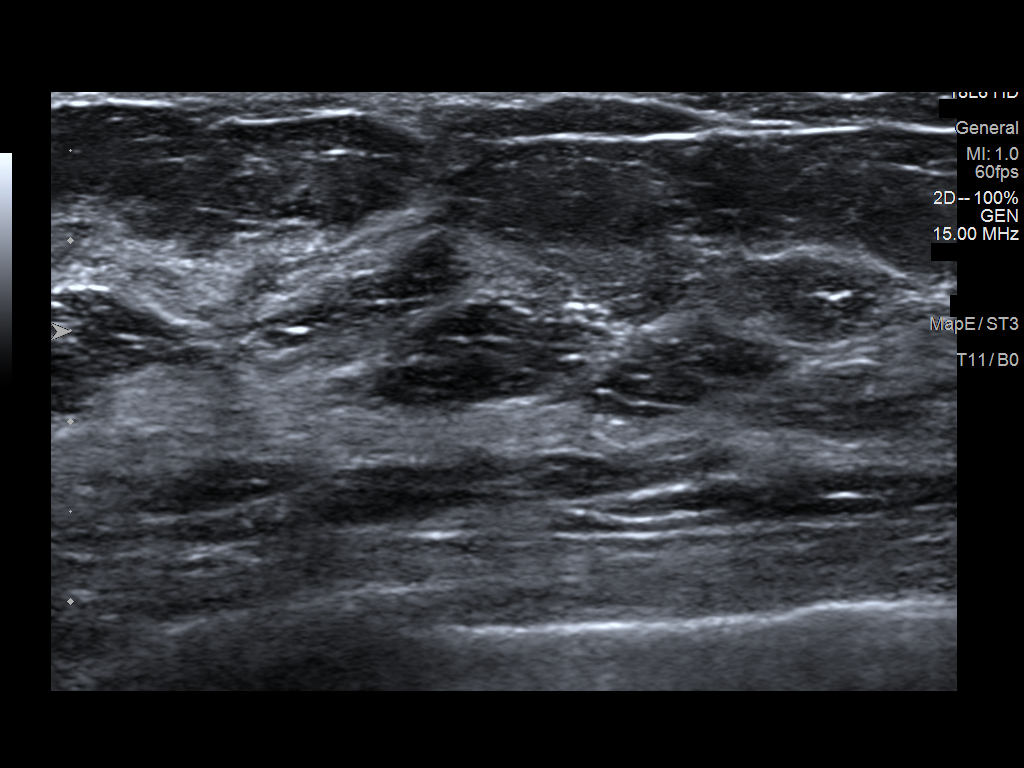
[im 2/2]
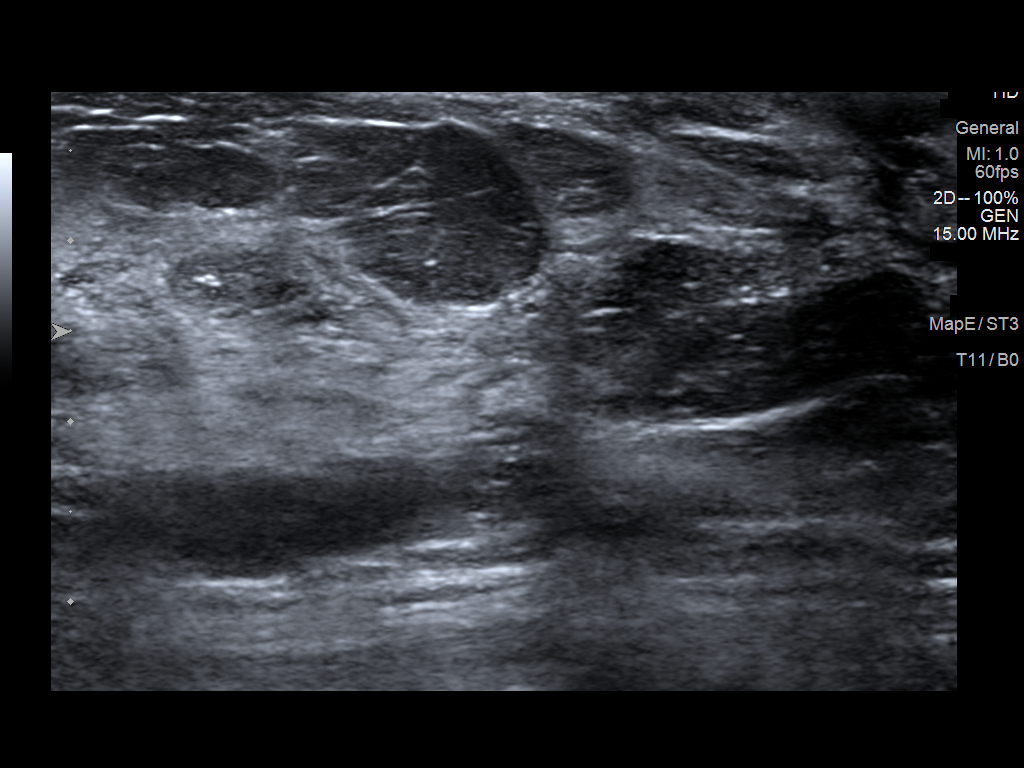

[2 of 2 positions shown; findings below may reference images not displayed]

ACR Breast Density Category c: The breast tissue is heterogeneously
dense, which may obscure small masses.
FINDINGS: Mammogram:

Right breast: Full field cc and rolled cc as well as full true
lateral spot compression tomosynthesis views of the right breast
were performed for a questioned asymmetry best seen on cc view in
the central slightly superior right breast. On the additional
imaging the asymmetry does not persist and most likely represented
normal fibroglandular tissue. There is no mass or distortion. No new
suspicious findings elsewhere in the right breast.

Ultrasound:

Targeted ultrasound performed throughout the superior central aspect
of the right breast demonstrating no cystic or solid mass.
IMPRESSION: No mammographic or sonographic evidence of malignancy in the
superior central right breast.

RECOMMENDATION:
Screening mammogram in one year.(Code:X0-I-LL4)

I have discussed the findings and recommendations with the patient.
If applicable, a reminder letter will be sent to the patient
regarding the next appointment.

BI-RADS CATEGORY  1: Negative.
